# Patient Record
Sex: Female | Born: 1980 | Race: Black or African American | Hispanic: No | Marital: Married | Smoking: Never smoker
Health system: Southern US, Community
[De-identification: ages and names within clinical notes are randomized; demographics above are authoritative.]

## PROBLEM LIST (undated history)

## (undated) DIAGNOSIS — B019 Varicella without complication: Secondary | ICD-10-CM

## (undated) DIAGNOSIS — R55 Syncope and collapse: Secondary | ICD-10-CM

## (undated) DIAGNOSIS — F32A Depression, unspecified: Secondary | ICD-10-CM

## (undated) DIAGNOSIS — F329 Major depressive disorder, single episode, unspecified: Secondary | ICD-10-CM

## (undated) HISTORY — DX: Depression, unspecified: F32.A

## (undated) HISTORY — DX: Major depressive disorder, single episode, unspecified: F32.9

## (undated) HISTORY — DX: Syncope and collapse: R55

## (undated) HISTORY — DX: Varicella without complication: B01.9

---

## 2003-11-21 ENCOUNTER — Other Ambulatory Visit: Admission: RE | Admit: 2003-11-21 | Discharge: 2003-11-21 | Payer: Self-pay | Admitting: Family Medicine

## 2004-07-28 ENCOUNTER — Other Ambulatory Visit: Admission: RE | Admit: 2004-07-28 | Discharge: 2004-07-28 | Payer: Self-pay | Admitting: Family Medicine

## 2005-05-24 ENCOUNTER — Other Ambulatory Visit: Admission: RE | Admit: 2005-05-24 | Discharge: 2005-05-24 | Payer: Self-pay | Admitting: *Deleted

## 2005-09-16 ENCOUNTER — Other Ambulatory Visit: Admission: RE | Admit: 2005-09-16 | Discharge: 2005-09-16 | Payer: Self-pay | Admitting: Obstetrics and Gynecology

## 2006-06-01 ENCOUNTER — Other Ambulatory Visit: Admission: RE | Admit: 2006-06-01 | Discharge: 2006-06-01 | Payer: Self-pay | Admitting: Obstetrics and Gynecology

## 2006-10-13 ENCOUNTER — Other Ambulatory Visit: Admission: RE | Admit: 2006-10-13 | Discharge: 2006-10-13 | Payer: Self-pay | Admitting: Obstetrics & Gynecology

## 2007-10-24 ENCOUNTER — Other Ambulatory Visit: Admission: RE | Admit: 2007-10-24 | Discharge: 2007-10-24 | Payer: Self-pay | Admitting: Obstetrics and Gynecology

## 2008-10-27 ENCOUNTER — Other Ambulatory Visit: Admission: RE | Admit: 2008-10-27 | Discharge: 2008-10-27 | Payer: Self-pay | Admitting: Obstetrics and Gynecology

## 2013-06-06 ENCOUNTER — Ambulatory Visit (INDEPENDENT_AMBULATORY_CARE_PROVIDER_SITE_OTHER): Payer: BC Managed Care – PPO | Admitting: Family Medicine

## 2013-06-06 ENCOUNTER — Encounter: Payer: Self-pay | Admitting: Family Medicine

## 2013-06-06 VITALS — BP 110/70 | HR 77 | Temp 98.4°F | Ht 68.0 in | Wt 161.8 lb

## 2013-06-06 DIAGNOSIS — Z0000 Encounter for general adult medical examination without abnormal findings: Secondary | ICD-10-CM | POA: Insufficient documentation

## 2013-06-06 DIAGNOSIS — Z1331 Encounter for screening for depression: Secondary | ICD-10-CM

## 2013-06-06 LAB — HEPATIC FUNCTION PANEL
AST: 14 U/L (ref 0–37)
Alkaline Phosphatase: 65 U/L (ref 39–117)
Bilirubin, Direct: 0.1 mg/dL (ref 0.0–0.3)
Total Protein: 7.7 g/dL (ref 6.0–8.3)

## 2013-06-06 LAB — LIPID PANEL
HDL: 51.8 mg/dL (ref >39.00)
LDL Cholesterol: 49 mg/dL (ref 0–99)
Total CHOL/HDL Ratio: 2
Triglycerides: 25 mg/dL (ref 0.0–149.0)

## 2013-06-06 LAB — BASIC METABOLIC PANEL
CO2: 27 mEq/L (ref 19–32)
Calcium: 9.2 mg/dL (ref 8.4–10.5)
GFR: 103.3 mL/min (ref >60.00)
Potassium: 3.9 mEq/L (ref 3.5–5.1)
Sodium: 139 mEq/L (ref 135–145)

## 2013-06-06 LAB — CBC WITH DIFFERENTIAL/PLATELET
Basophils Absolute: 0 10*3/uL (ref 0.0–0.1)
HCT: 38.7 % (ref 36.0–46.0)
Lymphocytes Relative: 34.2 % (ref 12.0–46.0)
Lymphs Abs: 1.2 10*3/uL (ref 0.7–4.0)
Monocytes Relative: 11 % (ref 3.0–12.0)
Neutrophils Relative %: 54 % (ref 43.0–77.0)
Platelets: 254 10*3/uL (ref 150.0–400.0)
RDW: 11.7 % (ref 11.5–14.6)
WBC: 3.5 10*3/uL — ABNORMAL LOW (ref 4.5–10.5)

## 2013-06-06 NOTE — Progress Notes (Signed)
  Subjective:    Patient ID: Charlotte Garza, female    DOB: 26-Sep-1981, 32 y.o.   MRN: [id number]  HPI New to establish.  Previous MD- Deboraha Sprang 'years and years ago'.  GYN- Dr Christella Hartigan, [facility]   Review of Systems Patient reports no vision/ hearing changes, adenopathy,fever, weight change,  persistant/recurrent hoarseness , swallowing issues, chest pain, palpitations, edema, persistant/recurrent cough, hemoptysis, dyspnea (rest/exertional/paroxysmal nocturnal), gastrointestinal bleeding (melena, rectal bleeding), abdominal pain, significant heartburn, bowel changes, GU symptoms (dysuria, hematuria, incontinence), Gyn symptoms (abnormal  bleeding, pain),  syncope, focal weakness, memory loss, numbness & tingling, skin/hair/nail changes, abnormal bruising or bleeding, anxiety, or depression.     Objective:   Physical Exam General Appearance:    Alert, cooperative, no distress, appears stated age  Head:    Normocephalic, without obvious abnormality, atraumatic  Eyes:    PERRL, conjunctiva/corneas clear, EOM's intact, fundi    benign, both eyes  Ears:    Normal TM's and external ear canals, both ears  Nose:   Nares normal, septum midline, mucosa normal, no drainage    or sinus tenderness  Throat:   Lips, mucosa, and tongue normal; teeth and gums normal  Neck:   Supple, symmetrical, trachea midline, no adenopathy;    Thyroid: no enlargement/tenderness/nodules  Back:     Symmetric, no curvature, ROM normal, no CVA tenderness  Lungs:     Clear to auscultation bilaterally, respirations unlabored  Chest Wall:    No tenderness or deformity   Heart:    Regular rate and rhythm, S1 and S2 normal, no murmur, rub   or gallop  Breast Exam:    Deferred to GYN  Abdomen:     Soft, non-tender, bowel sounds active all four quadrants,    no masses, no organomegaly  Genitalia:    Deferred to GYN  Rectal:    Extremities:   Extremities normal, atraumatic, no cyanosis or edema  Pulses:   2+ and symmetric all  extremities  Skin:   Skin color, texture, turgor normal, no rashes or lesions  Lymph nodes:   Cervical, supraclavicular, and axillary nodes normal  Neurologic:   CNII-XII intact, normal strength, sensation and reflexes    throughout          Assessment & Plan:

## 2013-06-06 NOTE — Patient Instructions (Addendum)
Follow up in 1 year or as needed Keep up the good work!  You look great!! We'll notify you of your lab results and make any changes if needed Welcome!  We're glad to have you!!!

## 2013-06-10 NOTE — Assessment & Plan Note (Signed)
Pt's PE WNL.  UTD on GYN.  Check labs.  Anticipatory guidance provided.  

## 2014-06-05 ENCOUNTER — Encounter: Payer: Self-pay | Admitting: Medical

## 2014-06-05 ENCOUNTER — Ambulatory Visit (INDEPENDENT_AMBULATORY_CARE_PROVIDER_SITE_OTHER): Payer: BC Managed Care – PPO | Admitting: Medical

## 2014-06-05 VITALS — BP 105/70 | HR 79 | Temp 98.2°F | Wt 168.0 lb

## 2014-06-05 DIAGNOSIS — M791 Myalgia, unspecified site: Secondary | ICD-10-CM

## 2014-06-05 DIAGNOSIS — IMO0001 Reserved for inherently not codable concepts without codable children: Secondary | ICD-10-CM

## 2014-06-05 DIAGNOSIS — S43499A Other sprain of unspecified shoulder joint, initial encounter: Secondary | ICD-10-CM

## 2014-06-05 DIAGNOSIS — S46819A Strain of other muscles, fascia and tendons at shoulder and upper arm level, unspecified arm, initial encounter: Secondary | ICD-10-CM | POA: Insufficient documentation

## 2014-06-05 DIAGNOSIS — S46811A Strain of other muscles, fascia and tendons at shoulder and upper arm level, right arm, initial encounter: Secondary | ICD-10-CM

## 2014-06-05 MED ORDER — DICLOFENAC SODIUM 75 MG PO TBEC: 75.0000 mg | DELAYED_RELEASE_TABLET | Freq: Two times a day (BID) | ORAL | Status: DC

## 2014-06-05 MED ORDER — CYCLOBENZAPRINE HCL 10 MG PO TABS: 10.0000 mg | ORAL_TABLET | Freq: Every day | ORAL | Status: DC

## 2014-06-05 MED ORDER — KETOROLAC TROMETHAMINE 60 MG/2ML IM SOLN
60.0000 mg | Freq: Once | INTRAMUSCULAR | Status: AC
  Administered 2014-06-05: 60 mg via INTRAMUSCULAR

## 2014-06-05 MED ORDER — TRAMADOL HCL 50 MG PO TABS: 50.0000 mg | ORAL_TABLET | Freq: Four times a day (QID) | ORAL | Status: DC | PRN

## 2014-06-05 NOTE — Patient Instructions (Signed)
Your appear to have a trapezius muscle strain. We gave you toradol im in the office. You can start diclofenac tomorrow. Tonight you can take muscle relaxant. In event of breakthrough pain can take tramadol. Massage right trapezius and try to relax. If pain persist consider cspine xray. If pain toward bicep persists this also would be reason to consider xray. Follow up in 7-10 days any persisting symptoms or as needed.

## 2014-06-05 NOTE — Progress Notes (Signed)
Pre visit review using our clinic review tool, if applicable. No additional management support is needed unless otherwise documented below in the visit note. 

## 2014-06-05 NOTE — Assessment & Plan Note (Signed)
Toradol im in office. Start diclofenac tomorrow. Rx cyclobenzaprene for night time use. Rx tramadol for break through  Pain. About to leave for vacation.

## 2014-06-05 NOTE — Progress Notes (Signed)
   Subjective:    Patient ID: Charlotte Garza, female    DOB: 01/24/1981, 33 y.o.   MRN: [id number]  HPI  Pt in with some neck pain. Present for 4 days. No injury or fall. Noticed when she first woke up. Pt did sleep in a different bed on saturday but no pain on Sunday. On Monday morning woke up with pain. Later in the day some pain down to rt bicep area. No numbness in rt upper extremity.  No exercise recently. Pt is on mirena. Pt is heading out of town.     Review of Systems  Constitutional: Negative for fever, chills and fatigue.  Respiratory: Negative for cough, chest tightness and wheezing.   Cardiovascular: Negative for chest pain and palpitations.  Gastrointestinal: Negative.   Musculoskeletal: Positive for myalgias and neck pain. Negative for arthralgias, back pain, gait problem, joint swelling and neck stiffness.       Rt side trapezius pain on neck movement. Rt bicep faint occasional pain that seems to extend from trapezius region. NO shoulder pain.  Skin: Negative.  Negative for color change, pallor and rash.  Neurological: Negative for dizziness, tremors, seizures, syncope, facial asymmetry, speech difficulty, weakness, light-headedness, numbness and headaches.       No sharp radiating type pain down her rt arm.  Hematological: Negative for adenopathy. Does not bruise/bleed easily.  Psychiatric/Behavioral: Negative.        Objective:   Physical Exam  Constitutional: She appears well-developed and well-nourished. No distress.  HENT:  Head: Normocephalic and atraumatic.  Neck: Normal range of motion. Neck supple. No JVD present. No tracheal deviation present. No thyromegaly present.  On rotation of neck both to left and rt side produces rt side trapezius pain. Over full body of trapezius. NO mid cspine pain.    Cardiovascular: Normal rate and regular rhythm.   Pulmonary/Chest: Effort normal and breath sounds normal. No stridor. No respiratory distress. She has no wheezes. She  has no rales. She exhibits no tenderness.  Musculoskeletal:  Rt shoulder- no pain on palpation. On shoulder rom she does have some faint trapezius pain.   Rt upper extremity- on flexion and extension of bicep and tricep no pain presntly. No pain on palpation.   Lymphadenopathy:    She has no cervical adenopathy.  Skin: She is not diaphoretic.  No rash of back or arm.  Psychiatric: She has a normal mood and affect. Her behavior is normal. Judgment and thought content normal.           Assessment & Plan:

## 2015-12-01 LAB — HM PAP SMEAR

## 2016-07-21 ENCOUNTER — Encounter: Payer: Self-pay | Admitting: Family Medicine

## 2016-07-21 ENCOUNTER — Ambulatory Visit (INDEPENDENT_AMBULATORY_CARE_PROVIDER_SITE_OTHER): Payer: BLUE CROSS/BLUE SHIELD | Admitting: Family Medicine

## 2016-07-21 VITALS — BP 120/80 | HR 76 | Temp 98.2°F | Resp 16 | Ht 68.0 in | Wt 168.1 lb

## 2016-07-21 DIAGNOSIS — M7062 Trochanteric bursitis, left hip: Secondary | ICD-10-CM | POA: Diagnosis not present

## 2016-07-21 MED ORDER — PREDNISONE 10 MG PO TABS: ORAL_TABLET | ORAL | 0 refills | Status: DC

## 2016-07-21 NOTE — Progress Notes (Signed)
Pre visit review using our clinic review tool, if applicable. No additional management support is needed unless otherwise documented below in the visit note. 

## 2016-07-21 NOTE — Patient Instructions (Signed)
Schedule your complete physical at your convenience Start the Prednisone as directed- take w/ food No additional ibuprofen or aleve while on the Prednisone but you can add Tylenol as needed Alternate ice/heat for pain relief Continue to exercise but listen to your body- if it hurts, don't do it! If no improvement, please let me know and we'll refer you to sports med Call with any questions or concerns Hang in there!!!

## 2016-07-21 NOTE — Progress Notes (Signed)
   Subjective:    Patient ID: Charlotte Garza, female    DOB: 02-Oct-1981, 35 y.o.   MRN: [id number]  HPI L low back pain- pt reports L hip pain x5 yrs, 'since I had my son'.  But as of 1 week ago her usual Aleve is not helping her pain.  Pain is currently constant.  Pain will radiate into buttock, L hip and thigh.  Difficulty standing on L leg.  Intermittent numbness/tingling.  sxs worsen w/ prolonged sitting or standing, being barefoot.  Pain improves w/ regular exercise.  No known injury.   Review of Systems For ROS see HPI     Objective:   Physical Exam  Constitutional: She is oriented to person, place, and time. She appears well-developed and well-nourished. No distress.  Cardiovascular: Intact distal pulses.   Musculoskeletal:  No TTP over lumbar spine, paraspinal muscles or sciatic notch + TTP over L greater trochanteric bursa  Neurological: She is alert and oriented to person, place, and time. She has normal reflexes.  (-) SLR bilaterally  Skin: Skin is warm and dry. No rash noted. No erythema.  Psychiatric: She has a normal mood and affect. Her behavior is normal. Thought content normal.  Vitals reviewed.         Assessment & Plan:  L greater trochanteric bursitis- new.  Pt's sxs and PE consistent w/ bursitis.  Start Prednisone taper.  Reviewed supportive care and red flags that should prompt return.  Pt expressed understanding and is in agreement w/ plan.

## 2016-11-30 ENCOUNTER — Ambulatory Visit (INDEPENDENT_AMBULATORY_CARE_PROVIDER_SITE_OTHER): Payer: BLUE CROSS/BLUE SHIELD | Admitting: Family Medicine

## 2016-11-30 ENCOUNTER — Encounter: Payer: Self-pay | Admitting: Family Medicine

## 2016-11-30 VITALS — BP 118/78 | HR 93 | Temp 98.1°F | Resp 16 | Ht 68.0 in | Wt 172.4 lb

## 2016-11-30 DIAGNOSIS — Z0000 Encounter for general adult medical examination without abnormal findings: Secondary | ICD-10-CM

## 2016-11-30 LAB — BASIC METABOLIC PANEL
BUN: 16 mg/dL (ref 7–25)
CO2: 22 mmol/L (ref 20–31)
Calcium: 9.2 mg/dL (ref 8.6–10.2)
Chloride: 106 mmol/L (ref 98–110)
Creat: 0.94 mg/dL (ref 0.50–1.10)
Glucose, Bld: 88 mg/dL (ref 65–99)
Potassium: 4.4 mmol/L (ref 3.5–5.3)
Sodium: 140 mmol/L (ref 135–146)

## 2016-11-30 LAB — HEPATIC FUNCTION PANEL
ALT: 13 U/L (ref 6–29)
AST: 15 U/L (ref 10–30)
Albumin: 4.1 g/dL (ref 3.6–5.1)
Alkaline Phosphatase: 64 U/L (ref 33–115)
Bilirubin, Direct: 0.1 mg/dL (ref <=0.2)
Indirect Bilirubin: 0.3 mg/dL (ref 0.2–1.2)
Total Bilirubin: 0.4 mg/dL (ref 0.2–1.2)
Total Protein: 7.1 g/dL (ref 6.1–8.1)

## 2016-11-30 LAB — LIPID PANEL
Cholesterol: 105 mg/dL (ref <200)
HDL: 57 mg/dL (ref >50)
LDL Cholesterol: 40 mg/dL (ref <100)
Total CHOL/HDL Ratio: 1.8 Ratio (ref <5.0)
Triglycerides: 40 mg/dL (ref <150)
VLDL: 8 mg/dL (ref <30)

## 2016-11-30 LAB — CBC WITH DIFFERENTIAL/PLATELET
BASOS PCT: 0 %
Basophils Absolute: 0 cells/uL (ref 0–200)
EOS ABS: 0 {cells}/uL — AB (ref 15–500)
Eosinophils Relative: 0 %
HEMATOCRIT: 34.9 % — AB (ref 35.0–45.0)
Hemoglobin: 11.4 g/dL — ABNORMAL LOW (ref 11.7–15.5)
LYMPHS PCT: 31 %
Lymphs Abs: 1736 cells/uL (ref 850–3900)
MCH: 29.5 pg (ref 27.0–33.0)
MCHC: 32.7 g/dL (ref 32.0–36.0)
MCV: 90.4 fL (ref 80.0–100.0)
MONO ABS: 448 {cells}/uL (ref 200–950)
MPV: 9.9 fL (ref 7.5–12.5)
Monocytes Relative: 8 %
Neutro Abs: 3416 cells/uL (ref 1500–7800)
Neutrophils Relative %: 61 %
Platelets: 339 10*3/uL (ref 140–400)
RBC: 3.86 MIL/uL (ref 3.80–5.10)
RDW: 12.3 % (ref 11.0–15.0)
WBC: 5.6 10*3/uL (ref 3.8–10.8)

## 2016-11-30 LAB — TSH: TSH: 0.75 mIU/L

## 2016-11-30 NOTE — Progress Notes (Signed)
Pre visit review using our clinic review tool, if applicable. No additional management support is needed unless otherwise documented below in the visit note. 

## 2016-11-30 NOTE — Patient Instructions (Signed)
Follow up in 1 year or as needed We'll notify you of your lab results and make any changes if needed Continue to work on healthy diet and regular exercise- you look great! Call with any questions or concerns [place]!!!

## 2016-11-30 NOTE — Progress Notes (Signed)
   Subjective:    Patient ID: Charlotte Garza, female    DOB: 11-12-1980, 36 y.o.   MRN: [id number]  HPI CPE- UTD on GYN, Tdap.  No concerns today.     Review of Systems Patient reports no vision/ hearing changes, adenopathy,fever, weight change,  persistant/recurrent hoarseness , swallowing issues, chest pain, palpitations, edema, persistant/recurrent cough, hemoptysis, dyspnea (rest/exertional/paroxysmal nocturnal), gastrointestinal bleeding (melena, rectal bleeding), abdominal pain, significant heartburn, bowel changes, GU symptoms (dysuria, hematuria, incontinence), Gyn symptoms (abnormal  bleeding, pain),  syncope, focal weakness, memory loss, numbness & tingling, skin/hair/nail changes, abnormal bruising or bleeding, anxiety, or depression.     Objective:   Physical Exam General Appearance:    Alert, cooperative, no distress, appears stated age  Head:    Normocephalic, without obvious abnormality, atraumatic  Eyes:    PERRL, conjunctiva/corneas clear, EOM's intact, fundi    benign, both eyes  Ears:    Normal TM's and external ear canals, both ears  Nose:   Nares normal, septum midline, mucosa normal, no drainage    or sinus tenderness  Throat:   Lips, mucosa, and tongue normal; teeth and gums normal  Neck:   Supple, symmetrical, trachea midline, no adenopathy;    Thyroid: no enlargement/tenderness/nodules  Back:     Symmetric, no curvature, ROM normal, no CVA tenderness  Lungs:     Clear to auscultation bilaterally, respirations unlabored  Chest Wall:    No tenderness or deformity   Heart:    Regular rate and rhythm, S1 and S2 normal, no murmur, rub   or gallop  Breast Exam:    Deferred to GYN  Abdomen:     Soft, non-tender, bowel sounds active all four quadrants,    no masses, no organomegaly  Genitalia:    Deferred to GYN  Rectal:    Extremities:   Extremities normal, atraumatic, no cyanosis or edema  Pulses:   2+ and symmetric all extremities  Skin:   Skin color, texture,  turgor normal, no rashes or lesions  Lymph nodes:   Cervical, supraclavicular, and axillary nodes normal  Neurologic:   CNII-XII intact, normal strength, sensation and reflexes    throughout          Assessment & Plan:

## 2016-11-30 NOTE — Assessment & Plan Note (Signed)
Pt's PE WNL.  UTD on GYN, Tdap, flu.  Check labs.  Anticipatory guidance provided.

## 2016-12-01 LAB — VITAMIN D 25 HYDROXY (VIT D DEFICIENCY, FRACTURES): Vit D, 25-Hydroxy: 33 ng/mL (ref 30–100)

## 2017-07-07 ENCOUNTER — Ambulatory Visit (INDEPENDENT_AMBULATORY_CARE_PROVIDER_SITE_OTHER): Payer: BLUE CROSS/BLUE SHIELD | Admitting: Family Medicine

## 2017-07-07 ENCOUNTER — Encounter: Payer: Self-pay | Admitting: Family Medicine

## 2017-07-07 VITALS — BP 121/81 | HR 82 | Temp 98.7°F | Resp 18 | Ht 68.0 in | Wt 170.2 lb

## 2017-07-07 DIAGNOSIS — R05 Cough: Secondary | ICD-10-CM | POA: Diagnosis not present

## 2017-07-07 DIAGNOSIS — R058 Other specified cough: Secondary | ICD-10-CM

## 2017-07-07 MED ORDER — ALBUTEROL SULFATE HFA 108 (90 BASE) MCG/ACT IN AERS: 2.0000 | INHALATION_SPRAY | Freq: Four times a day (QID) | RESPIRATORY_TRACT | 2 refills | Status: DC | PRN

## 2017-07-07 MED ORDER — PREDNISONE 10 MG PO TABS: ORAL_TABLET | ORAL | 0 refills | Status: DC

## 2017-07-07 MED ORDER — ALBUTEROL SULFATE (2.5 MG/3ML) 0.083% IN NEBU
2.5000 mg | INHALATION_SOLUTION | Freq: Once | RESPIRATORY_TRACT | Status: AC
  Administered 2017-07-07: 2.5 mg via RESPIRATORY_TRACT

## 2017-07-07 NOTE — Progress Notes (Signed)
Pre visit review using our clinic review tool, if applicable. No additional management support is needed unless otherwise documented below in the visit note. 

## 2017-07-07 NOTE — Progress Notes (Signed)
   Subjective:    Patient ID: Charlotte Garza, female    DOB: 07-19-1981, 36 y.o.   MRN: [id number]  HPI Cough- pt reports wheezing and coughing along w/ some chest and back discomfort.  sxs started ~3 weeks ago.  Doctor at work prescribed Zpack and cough pills- completed abx this AM.  No fevers.  'i don't feel bad'.  No sinus pain/pressure.  No ear pain.  No known sick contacts.   Review of Systems For ROS see HPI     Objective:   Physical Exam  Constitutional: She appears well-developed and well-nourished. No distress.  HENT:  Head: Normocephalic and atraumatic.  TMs normal bilaterally Mild nasal congestion Throat w/out erythema, edema, or exudate  Eyes: Pupils are equal, round, and reactive to light. Conjunctivae and EOM are normal.  Neck: Normal range of motion. Neck supple.  Cardiovascular: Normal rate, regular rhythm, normal heart sounds and intact distal pulses.   No murmur heard. Pulmonary/Chest: Effort normal and breath sounds normal. No respiratory distress. She has no wheezes.  + near continuous cough- improved s/p neb tx  Lymphadenopathy:    She has no cervical adenopathy.  Vitals reviewed.         Assessment & Plan:  Post-viral cough- new.  Pt is otherwise feeling good but cannot stop coughing and has pulled muscles doing so.  Cough improved s/p neb tx.  Start Pred taper and use albuterol prn.  Reviewed supportive care and red flags that should prompt return.  Pt expressed understanding and is in agreement w/ plan.

## 2017-07-07 NOTE — Patient Instructions (Signed)
Follow up as needed or as scheduled Start the Prednisone as directed Use the albuterol inhale as needed for cough Drink plenty of fluids Call with any questions or concerns Hang in there!!!

## 2017-07-07 NOTE — Addendum Note (Signed)
Addended by: Davis Gourd on: 07/07/2017 10:27 AM   Modules accepted: Orders

## 2017-11-22 ENCOUNTER — Ambulatory Visit: Payer: Self-pay | Admitting: *Deleted

## 2017-11-22 NOTE — Telephone Encounter (Signed)
Pt called stating that she is under a lot of stress at work and life situations; she is unable to sleep more than 4 hours of sleep and night, having stomach problems; she also is a ball of nerves and feels like she is not performing at her optimum; per nurse triage recommendations made to include seeing a physician within 24 hours; because of prior commitments, pt is not able to accept appointment prior to her previously schedduled with Dr Birdie Riddle prior to Monday 11/27/17 at 1000; she also states that she has an appointment with her marriage counselor today and she will also discuss this anxiety at that time.    Reason for Disposition . Symptoms interfere with work or school  Answer Assessment - Initial Assessment Questions 1. CONCERN: "What happened that made you call today?"     She sees it affecting her, more irritated 2. ANXIETY SYMPTOM SCREENING: "Can you describe how you have been feeling?"  (e.g., tense, restless, panicky, anxious, keyed up, trouble sleeping, trouble concentrating)     Trouble sleeping, anxiety 3. ONSET: "How long have you been feeling this way?"     Symptoms worsen as deadline approaches; taking melatonin (5 mg) but it no longer works; started months ago   4. RECURRENT: "Have you felt this way before?"  If yes: "What happened that time?" "What helped these feelings go away in the past?"      no 5. RISK OF HARM - SUICIDAL IDEATION:  "Do you ever have thoughts of hurting or killing yourself?"  (e.g., yes, no, no but preoccupation with thoughts about death)   - INTENT:  "Do you have thoughts of hurting or killing yourself right NOW?" (e.g., yes, no, N/A)   - PLAN: "Do you have a specific plan for how you would do this?" (e.g., gun, knife, overdose, no plan, N/A)     no 6. RISK OF HARM - HOMICIDAL IDEATION:  "Do you ever have thoughts of hurting or killing someone else?"  (e.g., yes, no, no but preoccupation with thoughts about death)   - INTENT:  "Do you have thoughts of  hurting or killing someone right NOW?" (e.g., yes, no, N/A)   - PLAN: "Do you have a specific plan for how you would do this?" (e.g., gun, knife, no plan, N/A)      no 7. FUNCTIONAL IMPAIRMENT: "How have things been going for you overall in your life? Have you had any more difficulties than usual doing your normal daily activities?"  (e.g., better, same, worse; self-care, school, work, interactions)     More stressful at work; about to start new program at work 68. SUPPORT: "Who is with you now?" "Who do you live with?" "Do you have family or friends nearby who you can talk to?"      Yes supportive husband and family 63. THERAPIST: "Do you have a counselor or therapist? Name?"     Marriage counselor, Dr Margaretmary Lombard 38. 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 CAFFEINE ABUSE: "Do you drink caffeinated beverages, and how much each day?" (e.g., coffee, tea, colas)       1 cup coffee in the morning 12. SUBSTANCE ABUSE: "Do you use any illegal drugs or alcohol?"       occassional glass of red wine in the evening 13. OTHER SYMPTOMS: "Do you  have any other physical symptoms right now?" (e.g., chest pain, palpitations, difficulty breathing, fever)       Problems with stomach (loose stools) 14. PREGNANCY: "Is there any chance you are pregnant?" "When was your last menstrual period?"       No LMP 11/17/17  Protocols used: ANXIETY AND PANIC ATTACK-A-AH

## 2017-11-24 ENCOUNTER — Ambulatory Visit: Payer: BLUE CROSS/BLUE SHIELD | Admitting: Family Medicine

## 2017-11-27 ENCOUNTER — Encounter: Payer: Self-pay | Admitting: General Practice

## 2017-11-27 ENCOUNTER — Ambulatory Visit: Payer: BLUE CROSS/BLUE SHIELD | Admitting: Family Medicine

## 2018-06-11 LAB — HM PAP SMEAR

## 2019-06-12 ENCOUNTER — Encounter: Payer: Self-pay | Admitting: Family Medicine

## 2019-06-12 ENCOUNTER — Other Ambulatory Visit: Payer: Self-pay

## 2019-06-12 ENCOUNTER — Ambulatory Visit (INDEPENDENT_AMBULATORY_CARE_PROVIDER_SITE_OTHER): Payer: BC Managed Care – PPO | Admitting: Family Medicine

## 2019-06-12 VITALS — Ht 68.0 in | Wt 178.0 lb

## 2019-06-12 DIAGNOSIS — U071 COVID-19: Secondary | ICD-10-CM

## 2019-06-12 DIAGNOSIS — R059 Cough, unspecified: Secondary | ICD-10-CM

## 2019-06-12 DIAGNOSIS — R05 Cough: Secondary | ICD-10-CM

## 2019-06-12 DIAGNOSIS — J988 Other specified respiratory disorders: Secondary | ICD-10-CM | POA: Diagnosis not present

## 2019-06-12 MED ORDER — FLUTICASONE PROPIONATE 50 MCG/ACT NA SUSP: 2.0000 | Freq: Every day | NASAL | 6 refills | Status: DC

## 2019-06-12 MED ORDER — GUAIFENESIN-CODEINE 100-10 MG/5ML PO SYRP: 10.0000 mL | ORAL_SOLUTION | Freq: Three times a day (TID) | ORAL | 0 refills | Status: DC | PRN

## 2019-06-12 MED ORDER — PREDNISONE 10 MG PO TABS: ORAL_TABLET | ORAL | 0 refills | Status: DC

## 2019-06-12 NOTE — Progress Notes (Signed)
I have discussed the procedure for the virtual visit with the patient who has given consent to proceed with assessment and treatment.   Jessica L Brodmerkel, CMA     

## 2019-06-12 NOTE — Progress Notes (Signed)
   Virtual Visit via Video   I connected with patient on 06/12/19 at  3:15 PM EDT by a video enabled telemedicine application and verified that I am speaking with the correct person using two identifiers.  Location patient: Home Location provider: Acupuncturist, Office Persons participating in the virtual visit: Patient, Provider, [place] (Jess B)  I discussed the limitations of evaluation and management by telemedicine and the availability of in person appointments. The patient expressed understanding and agreed to proceed.  Subjective:   HPI:   Cough- 'I had a cold about 2 weeks ago'.  Pt reports for 1.5 weeks she has had a 'cough w/ mucous'.  Using inhaler, taking allergy medication (Allegra), and OTC sinus medication (phenylephrine).  Tm 99.  Mild sinus headache.  No body aches.  No chills, no known sick contacts.  No known COVID exposures- wearing mask while in stores.  Cough is described 'as an annoyance' more than feeling sick.  Pt feels inhaler is temporarily helpful.  Never formally dx'd w/ asthma but has tendency to wheeze when sick.  ROS:   See pertinent positives and negatives per HPI.  Patient Active Problem List   Diagnosis Date Noted  . Trapezius strain 06/05/2014  . Routine general medical examination at a health care facility 06/06/2013    Social History   Tobacco Use  . Smoking status: Never Smoker  . Smokeless tobacco: Never Used  Substance Use Topics  . Alcohol use: Yes    Current Outpatient Medications:  .  albuterol (PROVENTIL HFA;VENTOLIN HFA) 108 (90 Base) MCG/ACT inhaler, Inhale 2 puffs into the lungs every 6 (six) hours as needed for wheezing or shortness of breath., Disp: 1 Inhaler, Rfl: 2 .  fexofenadine (ALLEGRA) 60 MG tablet, Take 60 mg by mouth 2 (two) times daily., Disp: , Rfl:   No Known Allergies  Objective:   Ht 5\' 8"  (1.727 m)   Wt 178 lb (80.7 kg)   BMI 27.06 kg/m   AAOx3, NAD NCAT, EOMI No obvious CN deficits Coloring WNL Pt  is able to speak clearly, coherently without shortness of breath or increased work of breathing.  Thought process is linear.  Mood is appropriate.   Assessment and Plan:   Cough- pt reports cough x1.5 weeks.  Increased wheezing.  Using inhaler w/ only temporary relief.  Will add Flonase to her daily allegra.  Arrange COVID testing- encouraged her to self isolate.  Start Prednisone taper and cough syrup to improve wheeze, cough, and SOB.  Reviewed supportive care and red flags that should prompt return.  Pt expressed understanding and is in agreement w/ plan.    Annye Asa, MD 06/12/2019

## 2019-06-13 ENCOUNTER — Other Ambulatory Visit: Payer: Self-pay

## 2019-06-13 ENCOUNTER — Encounter (INDEPENDENT_AMBULATORY_CARE_PROVIDER_SITE_OTHER): Payer: Self-pay

## 2019-06-13 DIAGNOSIS — Z20822 Contact with and (suspected) exposure to covid-19: Secondary | ICD-10-CM

## 2019-06-14 ENCOUNTER — Encounter (INDEPENDENT_AMBULATORY_CARE_PROVIDER_SITE_OTHER): Payer: Self-pay

## 2019-06-14 LAB — NOVEL CORONAVIRUS, NAA: SARS-CoV-2, NAA: NOT DETECTED

## 2019-12-16 ENCOUNTER — Ambulatory Visit (INDEPENDENT_AMBULATORY_CARE_PROVIDER_SITE_OTHER): Payer: 59 | Admitting: Family Medicine

## 2019-12-16 ENCOUNTER — Encounter: Payer: Self-pay | Admitting: Family Medicine

## 2019-12-16 ENCOUNTER — Other Ambulatory Visit: Payer: Self-pay

## 2019-12-16 VITALS — Temp 97.9°F | Ht 68.0 in | Wt 179.0 lb

## 2019-12-16 DIAGNOSIS — F419 Anxiety disorder, unspecified: Secondary | ICD-10-CM | POA: Diagnosis not present

## 2019-12-16 DIAGNOSIS — G4700 Insomnia, unspecified: Secondary | ICD-10-CM | POA: Diagnosis not present

## 2019-12-16 MED ORDER — ESCITALOPRAM OXALATE 5 MG PO TABS: 5.0000 mg | ORAL_TABLET | Freq: Every day | ORAL | 3 refills | Status: DC

## 2019-12-16 MED ORDER — TRAZODONE HCL 50 MG PO TABS: 25.0000 mg | ORAL_TABLET | Freq: Every evening | ORAL | 3 refills | Status: DC | PRN

## 2019-12-16 MED ORDER — TRAZODONE HCL 100 MG PO TABS: ORAL_TABLET | ORAL | 3 refills | Status: DC

## 2019-12-16 NOTE — Progress Notes (Signed)
I have discussed the procedure for the virtual visit with the patient who has given consent to proceed with assessment and treatment.   Charlotte Garza, CMA     

## 2019-12-16 NOTE — Progress Notes (Signed)
   Virtual Visit via Video   I connected with patient on 12/16/19 at  8:00 AM EST by a video enabled telemedicine application and verified that I am speaking with the correct person using two identifiers.  Location patient: Home Location provider: Acupuncturist, Office Persons participating in the virtual visit: Patient, Provider, [place] (Jess B)  I discussed the limitations of evaluation and management by telemedicine and the availability of in person appointments. The patient expressed understanding and agreed to proceed.  Subjective:   HPI:   Insomnia- started new job and has a lot of new stress and anxiety.  Sleeping habits 'have completely changed'.  Now having trouble falling asleep and staying asleep.  'I can't stop all of the thoughts'.  Pt reports she is 'able to focus during the day' but anxiety will 'all come up' at night when she is no longer busy.  ROS:   See pertinent positives and negatives per HPI.  Patient Active Problem List   Diagnosis Date Noted  . Trapezius strain 06/05/2014  . Routine general medical examination at a health care facility 06/06/2013    Social History   Tobacco Use  . Smoking status: Never Smoker  . Smokeless tobacco: Never Used  Substance Use Topics  . Alcohol use: Yes    Current Outpatient Medications:  .  fexofenadine (ALLEGRA) 60 MG tablet, Take 60 mg by mouth 2 (two) times daily., Disp: , Rfl:  .  albuterol (PROVENTIL HFA;VENTOLIN HFA) 108 (90 Base) MCG/ACT inhaler, Inhale 2 puffs into the lungs every 6 (six) hours as needed for wheezing or shortness of breath. (Patient not taking: Reported on 12/16/2019), Disp: 1 Inhaler, Rfl: 2 .  fluticasone (FLONASE) 50 MCG/ACT nasal spray, Place 2 sprays into both nostrils daily. (Patient not taking: Reported on 12/16/2019), Disp: 16 g, Rfl: 6  No Known Allergies  Objective:   Temp 97.9 F (36.6 C) (Oral)   Ht 5\' 8"  (1.727 m)   Wt 179 lb (81.2 kg)   BMI 27.22 kg/m   AAOx3, NAD NCAT,  EOMI No obvious CN deficits Coloring WNL Pt is able to speak clearly, coherently without shortness of breath or increased work of breathing.  Thought process is linear.  Mood is appropriate.   Assessment and Plan:   Insomnia- new.  Likely anxiety related.  Will start Trazodone for immediate relief of sxs while addressing the underlying anxiety w/ SSRI.  Pt expressed understanding and is in agreement w/ plan.   Anxiety- new.  Pt reports she has a lot of stress and anxiety w/ her new job.  Will send prescription for Lexapro 5mg  daily and pt will decide if she needs to start this in addition to the Trazodone.  Will follow closely   Annye Asa, MD 12/16/2019

## 2019-12-17 ENCOUNTER — Telehealth: Payer: Self-pay | Admitting: Family Medicine

## 2019-12-17 NOTE — Telephone Encounter (Signed)
LM for pt to call back to schedule a VV in about 3-4wks for a insomnia f/up

## 2020-01-07 ENCOUNTER — Other Ambulatory Visit: Payer: Self-pay | Admitting: Family Medicine

## 2020-01-10 ENCOUNTER — Ambulatory Visit: Payer: 59

## 2020-01-13 ENCOUNTER — Encounter: Payer: Self-pay | Admitting: Family Medicine

## 2020-01-13 ENCOUNTER — Other Ambulatory Visit: Payer: Self-pay

## 2020-01-13 ENCOUNTER — Telehealth (INDEPENDENT_AMBULATORY_CARE_PROVIDER_SITE_OTHER): Payer: 59 | Admitting: Family Medicine

## 2020-01-13 DIAGNOSIS — F419 Anxiety disorder, unspecified: Secondary | ICD-10-CM

## 2020-01-13 DIAGNOSIS — G4700 Insomnia, unspecified: Secondary | ICD-10-CM

## 2020-01-13 NOTE — Progress Notes (Signed)
I have discussed the procedure for the virtual visit with the patient who has given consent to proceed with assessment and treatment.   Pt unable to obtain vitals.   Charlotte Garza, CMA     

## 2020-01-13 NOTE — Progress Notes (Signed)
   Virtual Visit via Video   I connected with patient on 01/13/20 at  1:00 PM EDT by a video enabled telemedicine application and verified that I am speaking with the correct person using two identifiers.  Location patient: Home Location provider: Acupuncturist, Office Persons participating in the virtual visit: Patient, Provider, [place] (Jess B)  I discussed the limitations of evaluation and management by telemedicine and the availability of in person appointments. The patient expressed understanding and agreed to proceed.  Subjective:   HPI:   Anxiety/Insomnia- at last visit she was started on Lexapro 5mg  daily and Trazodone for sleep.  She reports that she is feeling 'much better' since starting medication.  She reports that if she doesn't take the trazodone at night she will still wake at 2-3am but when she takes the medication she is able to sleep well, still wake up early, and have no groggy feelings or medication hangover.  ROS:   See pertinent positives and negatives per HPI.  Patient Active Problem List   Diagnosis Date Noted  . Trapezius strain 06/05/2014  . Routine general medical examination at a health care facility 06/06/2013    Social History   Tobacco Use  . Smoking status: Never Smoker  . Smokeless tobacco: Never Used  Substance Use Topics  . Alcohol use: Yes    Current Outpatient Medications:  .  albuterol (PROVENTIL HFA;VENTOLIN HFA) 108 (90 Base) MCG/ACT inhaler, Inhale 2 puffs into the lungs every 6 (six) hours as needed for wheezing or shortness of breath., Disp: 1 Inhaler, Rfl: 2 .  escitalopram (LEXAPRO) 5 MG tablet, TAKE 1 TABLET BY MOUTH EVERY DAY, Disp: 90 tablet, Rfl: 1 .  fexofenadine (ALLEGRA) 60 MG tablet, Take 60 mg by mouth 2 (two) times daily., Disp: , Rfl:  .  fluticasone (FLONASE) 50 MCG/ACT nasal spray, Place 2 sprays into both nostrils daily., Disp: 16 g, Rfl: 6 .  traZODone (DESYREL) 50 MG tablet, TAKE 0.5-1 TABLETS (25-50 MG TOTAL)  BY MOUTH AT BEDTIME AS NEEDED FOR SLEEP., Disp: 90 tablet, Rfl: 1  No Known Allergies  Objective:   There were no vitals taken for this visit.  AAOx3, NAD NCAT, EOMI No obvious CN deficits Coloring WNL Pt is able to speak clearly, coherently without shortness of breath or increased work of breathing.  Thought process is linear.  Mood is appropriate.   Assessment and Plan:   Anxiety/Insomnia- both have improved since last visit w/ the addition of Lexapro and Trazodone.  Pt is very pleased with the results and has no desire to change meds at this time.  Will follow.   Annye Asa, MD 01/13/2020

## 2020-04-22 ENCOUNTER — Telehealth: Payer: Self-pay | Admitting: Family Medicine

## 2020-04-22 NOTE — Telephone Encounter (Signed)
Unfortunately, pt would need an appointment for evaluation. We cannot just send in a medication per PCP.

## 2020-04-22 NOTE — Telephone Encounter (Signed)
Patient is experiencing effects from sun poisoning on her arms.  She is asking about a cream that could be called in - she is not sure what it is.

## 2020-04-22 NOTE — Telephone Encounter (Signed)
Called and left voicemail telling patient that she would need to be seen.

## 2020-04-30 ENCOUNTER — Encounter: Payer: Self-pay | Admitting: Family Medicine

## 2020-04-30 ENCOUNTER — Other Ambulatory Visit: Payer: Self-pay

## 2020-04-30 ENCOUNTER — Telehealth (INDEPENDENT_AMBULATORY_CARE_PROVIDER_SITE_OTHER): Payer: No Typology Code available for payment source | Admitting: Family Medicine

## 2020-04-30 DIAGNOSIS — L564 Polymorphous light eruption: Secondary | ICD-10-CM

## 2020-04-30 MED ORDER — TRIAMCINOLONE ACETONIDE 0.1 % EX OINT: 1.0000 "application " | TOPICAL_OINTMENT | Freq: Two times a day (BID) | CUTANEOUS | 1 refills | Status: AC

## 2020-04-30 NOTE — Progress Notes (Signed)
   Virtual Visit via Video   I connected with patient on 04/30/20 at  9:30 AM EDT by a video enabled telemedicine application and verified that I am speaking with the correct person using two identifiers.  Location patient: Home Location provider: Acupuncturist, Office Persons participating in the virtual visit: Patient, Provider, [place] (Jess B)  I discussed the limitations of evaluation and management by telemedicine and the availability of in person appointments. The patient expressed understanding and agreed to proceed.  Subjective:   HPI:   Sun poisoning- pt reports she will get 'sun poisoning on my arms' over the last few years.  Pt reports she will break out in 'bumps which can turn into blisters'.  Pt reports she will have a 'burning itch'.  Pt has breakouts if UV rays are high.  Pt reports sunscreen SPF 50-70 is helpful, will wear a long sleeve swim top at times.  Mom has similar sun sensitivity and uses Triamcinolone 0.1% to help.  Rash appears after sun exposure.  ROS:   See pertinent positives and negatives per HPI.  Patient Active Problem List   Diagnosis Date Noted  . Trapezius strain 06/05/2014  . Routine general medical examination at a health care facility 06/06/2013    Social History   Tobacco Use  . Smoking status: Never Smoker  . Smokeless tobacco: Never Used  Substance Use Topics  . Alcohol use: Yes    Current Outpatient Medications:  .  albuterol (PROVENTIL HFA;VENTOLIN HFA) 108 (90 Base) MCG/ACT inhaler, Inhale 2 puffs into the lungs every 6 (six) hours as needed for wheezing or shortness of breath., Disp: 1 Inhaler, Rfl: 2 .  escitalopram (LEXAPRO) 5 MG tablet, TAKE 1 TABLET BY MOUTH EVERY DAY, Disp: 90 tablet, Rfl: 1 .  fexofenadine (ALLEGRA) 60 MG tablet, Take 60 mg by mouth 2 (two) times daily., Disp: , Rfl:  .  fluticasone (FLONASE) 50 MCG/ACT nasal spray, Place 2 sprays into both nostrils daily., Disp: 16 g, Rfl: 6 .  traZODone (DESYREL) 50 MG  tablet, TAKE 0.5-1 TABLETS (25-50 MG TOTAL) BY MOUTH AT BEDTIME AS NEEDED FOR SLEEP., Disp: 90 tablet, Rfl: 1  No Known Allergies  Objective:   There were no vitals taken for this visit. AAOx3, NAD NCAT, EOMI No obvious CN deficits Coloring WNL Pt is able to speak clearly, coherently without shortness of breath or increased work of breathing.  Thought process is linear.  Mood is appropriate.   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  Occurs with extensive or prolonged sun exposure.  Takes Allegra daily.  Discussed sun protection/prevention.  Will send prescription for Triamcinolone ointment for pt to use if eruption occurs.  Pt expressed understanding and is in agreement w/ plan.    Annye Asa, MD 04/30/2020

## 2020-04-30 NOTE — Progress Notes (Signed)
I have discussed the procedure for the virtual visit with the patient who has given consent to proceed with assessment and treatment.   Pt unable to obtain vitals.   Pt is concerned about the sun poisoning she gets. Is there something she could have on hand in case it happens again.   Davis Gourd, CMA

## 2020-07-23 ENCOUNTER — Other Ambulatory Visit: Payer: Self-pay | Admitting: Family Medicine

## 2020-08-05 ENCOUNTER — Encounter: Payer: Self-pay | Admitting: Family Medicine

## 2020-08-05 ENCOUNTER — Ambulatory Visit (INDEPENDENT_AMBULATORY_CARE_PROVIDER_SITE_OTHER)
Admission: RE | Admit: 2020-08-05 | Discharge: 2020-08-05 | Disposition: A | Discharge status: Home / Self Care | Payer: No Typology Code available for payment source | Source: Ambulatory Visit | Attending: Family Medicine | Admitting: Family Medicine

## 2020-08-05 ENCOUNTER — Ambulatory Visit: Payer: No Typology Code available for payment source | Admitting: Family Medicine

## 2020-08-05 ENCOUNTER — Other Ambulatory Visit: Payer: Self-pay

## 2020-08-05 VITALS — BP 122/78 | HR 94 | Temp 98.2°F | Resp 15 | Ht 68.0 in | Wt 185.0 lb

## 2020-08-05 DIAGNOSIS — M545 Low back pain, unspecified: Secondary | ICD-10-CM

## 2020-08-05 DIAGNOSIS — Z23 Encounter for immunization: Secondary | ICD-10-CM | POA: Diagnosis not present

## 2020-08-05 MED ORDER — MELOXICAM 15 MG PO TABS: 15.0000 mg | ORAL_TABLET | Freq: Every day | ORAL | 0 refills | Status: DC

## 2020-08-05 NOTE — Progress Notes (Signed)
   Subjective:    Patient ID: Charlotte Garza, female    DOB: 08/15/1981, 39 y.o.   MRN: [id number]  HPI Sacral pain- sxs started since birth of her son 8 yrs ago.  Within the last 3 months 'pain has changed'.  Pain is now 'dull'.  No pain w/ working out.  Pain is notable when sitting or lying down.  Pt reports pain is in low back and down into tailbone.  Pain starts at waistband and extends down.  No TTP.  Has been using 'back patches' frequently.  Pt reports rare radiation of pain into thigh.  Pain is primarily R sided.  No known injury.  Pt reports she does have mild scoliosis.  Has not had any imaging of low back.  Pt reports less pain when leaning forward.   Review of Systems For ROS see HPI   This visit occurred during the SARS-CoV-2 public health emergency.  Safety protocols were in place, including screening questions prior to the visit, additional usage of staff PPE, and extensive cleaning of exam room while observing appropriate contact time as indicated for disinfecting solutions.       Objective:   Physical Exam Vitals reviewed.  Constitutional:      General: She is not in acute distress.    Appearance: Normal appearance. She is not ill-appearing.  HENT:     Head: Normocephalic and atraumatic.  Eyes:     Extraocular Movements: Extraocular movements intact.     Conjunctiva/sclera: Conjunctivae normal.     Pupils: Pupils are equal, round, and reactive to light.  Cardiovascular:     Pulses: Normal pulses.  Musculoskeletal:        General: No tenderness or deformity.     Comments: No deformity or TTP over spine Good forward flexion of spine, pain w/ extension  Skin:    General: Skin is warm and dry.     Findings: No rash.  Neurological:     General: No focal deficit present.     Mental Status: She is alert and oriented to person, place, and time.     Cranial Nerves: No cranial nerve deficit.     Motor: No weakness.     Coordination: Coordination normal.     Gait: Gait  normal.     Deep Tendon Reflexes: Reflexes normal.     Comments: Mildly + SLR bilaterally  Psychiatric:        Mood and Affect: Mood normal.        Behavior: Behavior normal.        Thought Content: Thought content normal.           Assessment & Plan:  Lumbosacral pain- new to provider, ongoing for pt.  Sxs started yrs ago but changed in the last 3 months.  Pain worse w/ sitting or lying.  Pain w/ back extension.  No TTP over spine.  Will get lumbosacral xrays to assess.  Start daily NSAID for inflammation.  If no major abnormalities on xrays will refer to PT.  Pt expressed understanding and is in agreement w/ plan.

## 2020-08-05 NOTE — Patient Instructions (Signed)
Follow up as needed or as scheduled Go to [place] [facility] and get your xray done.  No appt needed.  Radiology is in the basement. START the Meloxicam once daily- take w/ food- to help w/ inflammation Once I can review the xrays, we'll refer to PT if still appropriate Call with any questions or concerns Hang in there!

## 2020-08-07 ENCOUNTER — Other Ambulatory Visit: Payer: Self-pay | Admitting: General Practice

## 2020-08-07 DIAGNOSIS — M545 Low back pain, unspecified: Secondary | ICD-10-CM

## 2021-03-04 ENCOUNTER — Other Ambulatory Visit: Payer: Self-pay | Admitting: Family Medicine

## 2021-03-04 NOTE — Telephone Encounter (Signed)
LFD 07/23/20 #90 with 1 refill LOV  08/05/20 NOV none

## 2021-04-07 ENCOUNTER — Encounter: Payer: Self-pay | Admitting: *Deleted

## 2021-09-06 ENCOUNTER — Encounter: Payer: Self-pay | Admitting: Family Medicine

## 2021-09-06 ENCOUNTER — Ambulatory Visit: Payer: No Typology Code available for payment source | Admitting: Family Medicine

## 2021-09-06 VITALS — BP 112/72 | HR 88 | Temp 98.4°F | Resp 16 | Ht 68.0 in | Wt 189.4 lb

## 2021-09-06 DIAGNOSIS — Z0000 Encounter for general adult medical examination without abnormal findings: Secondary | ICD-10-CM | POA: Diagnosis not present

## 2021-09-06 DIAGNOSIS — Z1159 Encounter for screening for other viral diseases: Secondary | ICD-10-CM

## 2021-09-06 DIAGNOSIS — E663 Overweight: Secondary | ICD-10-CM | POA: Diagnosis not present

## 2021-09-06 DIAGNOSIS — Z23 Encounter for immunization: Secondary | ICD-10-CM | POA: Diagnosis not present

## 2021-09-06 DIAGNOSIS — Z114 Encounter for screening for human immunodeficiency virus [HIV]: Secondary | ICD-10-CM | POA: Diagnosis not present

## 2021-09-06 NOTE — Assessment & Plan Note (Signed)
Pt's PE WNL.  UTD on pap, Tdap.  Flu shot given today.  Check labs.  Anticipatory guidance provided.

## 2021-09-06 NOTE — Progress Notes (Signed)
   Subjective:    Patient ID: Charlotte Garza, female    DOB: April 23, 1981, 40 y.o.   MRN: [id number]  HPI CPE- UTD on Tdap.  Due for flu.  UTD on pap.  Health Maintenance  Topic Date Due   HIV Screening  Never done   Hepatitis C Screening  Never done   COVID-19 Vaccine (3 - Booster for Pfizer series) 04/07/2020   INFLUENZA VACCINE  05/10/2021   TETANUS/TDAP  12/15/2021   PAP SMEAR-Modifier  11/27/2023   Pneumococcal Vaccine 61-36 Years old  Aged Out   HPV VACCINES  Aged Out      Review of Systems Patient reports no vision/ hearing changes, adenopathy,fever, weight change,  persistant/recurrent hoarseness , swallowing issues, chest pain, palpitations, edema, persistant/recurrent cough, hemoptysis, dyspnea (rest/exertional/paroxysmal nocturnal), gastrointestinal bleeding (melena, rectal bleeding), abdominal pain, significant heartburn, bowel changes, GU symptoms (dysuria, hematuria, incontinence), Gyn symptoms (abnormal  bleeding, pain),  syncope, focal weakness, memory loss, numbness & tingling, hair/nail changes, abnormal bruising or bleeding, anxiety, or depression.   'red mole' on L breast  This visit occurred during the SARS-CoV-2 public health emergency.  Safety protocols were in place, including screening questions prior to the visit, additional usage of staff PPE, and extensive cleaning of exam room while observing appropriate contact time as indicated for disinfecting solutions.      Objective:   Physical Exam General Appearance:    Alert, cooperative, no distress, appears stated age  Head:    Normocephalic, without obvious abnormality, atraumatic  Eyes:    PERRL, conjunctiva/corneas clear, EOM's intact, fundi    benign, both eyes  Ears:    Normal TM's and external ear canals, both ears  Nose:   Deferred due to COVID  Throat:   Neck:   Supple, symmetrical, trachea midline, no adenopathy;    Thyroid: no enlargement/tenderness/nodules  Back:     Symmetric, no curvature, ROM  normal, no CVA tenderness  Lungs:     Clear to auscultation bilaterally, respirations unlabored  Chest Wall:    No tenderness or deformity   Heart:    Regular rate and rhythm, S1 and S2 normal, no murmur, rub   or gallop  Breast Exam:    Deferred to GYN  Abdomen:     Soft, non-tender, bowel sounds active all four quadrants,    no masses, no organomegaly  Genitalia:    Deferred to GYN  Rectal:    Extremities:   Extremities normal, atraumatic, no cyanosis or edema  Pulses:   2+ and symmetric all extremities  Skin:   Skin color, texture, turgor normal, no rashes or lesions.  Small cherry angioma on L breast  Lymph nodes:   Cervical, supraclavicular, and axillary nodes normal  Neurologic:   CNII-XII intact, normal strength, sensation and reflexes    throughout          Assessment & Plan:

## 2021-09-06 NOTE — Patient Instructions (Addendum)
Follow up in 1 year or as needed We'll notify you of your lab results and make any changes if needed Continue to work on healthy diet and regular exercise- you're doing great!!! Call with any questions or concerns Stay Safe!  Stay Healthy!! Happy Holidays!!! 

## 2021-09-07 LAB — BASIC METABOLIC PANEL
BUN: 15 mg/dL (ref 6–23)
CO2: 27 mEq/L (ref 19–32)
Calcium: 9.5 mg/dL (ref 8.4–10.5)
Chloride: 101 mEq/L (ref 96–112)
Creatinine, Ser: 0.9 mg/dL (ref 0.40–1.20)
GFR: 80.83 mL/min (ref >60.00)
Glucose, Bld: 82 mg/dL (ref 70–99)
Potassium: 4 mEq/L (ref 3.5–5.1)
Sodium: 137 mEq/L (ref 135–145)

## 2021-09-07 LAB — LIPID PANEL
Cholesterol: 145 mg/dL (ref 0–200)
HDL: 70 mg/dL (ref >39.00)
LDL Cholesterol: 56 mg/dL (ref 0–99)
NonHDL: 75
Total CHOL/HDL Ratio: 2
Triglycerides: 93 mg/dL (ref 0.0–149.0)
VLDL: 18.6 mg/dL (ref 0.0–40.0)

## 2021-09-07 LAB — HEPATIC FUNCTION PANEL
ALT: 21 U/L (ref 0–35)
AST: 23 U/L (ref 0–37)
Albumin: 4.4 g/dL (ref 3.5–5.2)
Alkaline Phosphatase: 68 U/L (ref 39–117)
Bilirubin, Direct: 0.1 mg/dL (ref 0.0–0.3)
Total Bilirubin: 0.6 mg/dL (ref 0.2–1.2)
Total Protein: 7.4 g/dL (ref 6.0–8.3)

## 2021-09-07 LAB — CBC WITH DIFFERENTIAL/PLATELET
Basophils Absolute: 0 10*3/uL (ref 0.0–0.1)
Basophils Relative: 0.7 % (ref 0.0–3.0)
Eosinophils Absolute: 0 10*3/uL (ref 0.0–0.7)
Eosinophils Relative: 0.6 % (ref 0.0–5.0)
HCT: 39.6 % (ref 36.0–46.0)
Hemoglobin: 13.2 g/dL (ref 12.0–15.0)
Lymphocytes Relative: 18.8 % (ref 12.0–46.0)
Lymphs Abs: 1.1 10*3/uL (ref 0.7–4.0)
MCHC: 33.3 g/dL (ref 30.0–36.0)
MCV: 92.5 fl (ref 78.0–100.0)
Monocytes Absolute: 0.5 10*3/uL (ref 0.1–1.0)
Monocytes Relative: 9.4 % (ref 3.0–12.0)
Neutro Abs: 4.1 10*3/uL (ref 1.4–7.7)
Neutrophils Relative %: 70.5 % (ref 43.0–77.0)
Platelets: 298 10*3/uL (ref 150.0–400.0)
RBC: 4.28 Mil/uL (ref 3.87–5.11)
RDW: 12.2 % (ref 11.5–15.5)
WBC: 5.8 10*3/uL (ref 4.0–10.5)

## 2021-09-07 LAB — HIV ANTIBODY (ROUTINE TESTING W REFLEX): HIV 1&2 Ab, 4th Generation: NONREACTIVE

## 2021-09-07 LAB — TSH: TSH: 1.11 u[IU]/mL (ref 0.35–5.50)

## 2021-09-07 LAB — HEPATITIS C ANTIBODY
Hepatitis C Ab: NONREACTIVE
SIGNAL TO CUT-OFF: 0.07 (ref <1.00)

## 2021-09-09 ENCOUNTER — Telehealth: Payer: Self-pay

## 2021-09-09 NOTE — Telephone Encounter (Signed)
Lvm for patient letting her know that her labs looked great.

## 2021-09-09 NOTE — Telephone Encounter (Signed)
-----   Message from Midge Minium, MD sent at 09/07/2021  2:19 PM EST ----- Labs look great!  No changes at this time

## 2021-09-11 ENCOUNTER — Other Ambulatory Visit: Payer: Self-pay | Admitting: Family Medicine

## 2021-09-13 NOTE — Telephone Encounter (Signed)
Patient is requesting a refill of the following medications: Requested Prescriptions   Pending Prescriptions Disp Refills   traZODone (DESYREL) 50 MG tablet [Pharmacy Med Name: TRAZODONE 50 MG TABLET] 90 tablet 1    Sig: TAKE 1/2 TO 1 TABLET BY MOUTH AT BEDTIME AS NEEDED FOR SLEEP    Date of patient request: 09/11/2021 Last office visit: 09/06/2021 Date of last refill: 03/04/2021 Last refill amount: 90 tablets 1 refill  Follow up time period per chart: 09/07/2022

## 2021-11-23 ENCOUNTER — Ambulatory Visit: Payer: No Typology Code available for payment source | Admitting: Family Medicine

## 2021-11-23 ENCOUNTER — Encounter: Payer: Self-pay | Admitting: Family Medicine

## 2021-11-23 VITALS — BP 126/70 | HR 74 | Temp 98.8°F | Resp 16 | Ht 68.0 in | Wt 186.4 lb

## 2021-11-23 DIAGNOSIS — R140 Abdominal distension (gaseous): Secondary | ICD-10-CM

## 2021-11-23 DIAGNOSIS — K219 Gastro-esophageal reflux disease without esophagitis: Secondary | ICD-10-CM

## 2021-11-23 DIAGNOSIS — R195 Other fecal abnormalities: Secondary | ICD-10-CM | POA: Diagnosis not present

## 2021-11-23 LAB — CBC WITH DIFFERENTIAL/PLATELET
Basophils Absolute: 0 10*3/uL (ref 0.0–0.1)
Basophils Relative: 0.4 % (ref 0.0–3.0)
Eosinophils Absolute: 0 10*3/uL (ref 0.0–0.7)
Eosinophils Relative: 1.1 % (ref 0.0–5.0)
HCT: 41.6 % (ref 36.0–46.0)
Hemoglobin: 13.5 g/dL (ref 12.0–15.0)
Lymphocytes Relative: 30.4 % (ref 12.0–46.0)
Lymphs Abs: 1.1 10*3/uL (ref 0.7–4.0)
MCHC: 32.4 g/dL (ref 30.0–36.0)
MCV: 92.9 fl (ref 78.0–100.0)
Monocytes Absolute: 0.4 10*3/uL (ref 0.1–1.0)
Monocytes Relative: 11.3 % (ref 3.0–12.0)
Neutro Abs: 2.1 10*3/uL (ref 1.4–7.7)
Neutrophils Relative %: 56.8 % (ref 43.0–77.0)
Platelets: 277 10*3/uL (ref 150.0–400.0)
RBC: 4.47 Mil/uL (ref 3.87–5.11)
RDW: 11.9 % (ref 11.5–15.5)
WBC: 3.7 10*3/uL — ABNORMAL LOW (ref 4.0–10.5)

## 2021-11-23 LAB — HCG, QUANTITATIVE, PREGNANCY: Quantitative HCG: <0.6 m[IU]/mL

## 2021-11-23 MED ORDER — OMEPRAZOLE 20 MG PO CPDR: 20.0000 mg | DELAYED_RELEASE_CAPSULE | Freq: Every day | ORAL | 3 refills | Status: DC

## 2021-11-23 MED ORDER — DICYCLOMINE HCL 20 MG PO TABS: 20.0000 mg | ORAL_TABLET | Freq: Three times a day (TID) | ORAL | 1 refills | Status: DC

## 2021-11-23 NOTE — Progress Notes (Signed)
° °  Subjective:    Patient ID: Charlotte Garza, female    DOB: 03-Jun-1981, 41 y.o.   MRN: [id number]  HPI GI issues- pt reports discomfort that lasts 'all day'.  'constant bubble guts'.  Pt reports she can hear her bowels throughout the day.  Now having loose stools and feels the need to go the bathroom multiple times/day.  Is having 1-3 BMs daily.  Just recently developed acid reflux.  Some improvement w/ Tums yesterday.  Sxs started ~2 weeks ago.  No recent abx.  No change in medications but is taking Mg 400mg  daily.  Did stop Magnesium in the last week to see if anything changed, but it did not.  No change in diet.  Pt reports work is very stressful.  No N/V.  + bloating.  Pt states no chance of pregnancy.  LMP 11/06/21.  Pt has had IBS symptoms in the past.     Review of Systems For ROS see HPI   This visit occurred during the SARS-CoV-2 public health emergency.  Safety protocols were in place, including screening questions prior to the visit, additional usage of staff PPE, and extensive cleaning of exam room while observing appropriate contact time as indicated for disinfecting solutions.      Objective:   Physical Exam Vitals reviewed.  Constitutional:      General: She is not in acute distress.    Appearance: She is well-developed.  Abdominal:     General: Bowel sounds are normal. There is distension.     Palpations: Abdomen is soft. There is no fluid wave or mass.     Tenderness: There is no abdominal tenderness. There is no rebound.     Hernia: No hernia is present.  Skin:    General: Skin is warm and dry.  Neurological:     General: No focal deficit present.     Mental Status: She is alert.  Psychiatric:        Mood and Affect: Mood normal. Mood is not anxious.        Behavior: Behavior normal.          Assessment & Plan:  Abdominal bloating- new.  Sxs started ~2 weeks ago.  Suspect component of IBS.  Will add Bentyl- scheduled to start and then prn.  Pt expressed  understanding and is in agreement w/ plan.   Loose stools- new.  Pt reports 1-3 BMs daily but having the constant feeling of needing to go.  Suspect this is IBS and pt is internalizing more stress than she realizes.  Encouraged increased fiber in her diet.  Will continue to hold Magnesium as it has laxative properties.  Encourage increased water intake.  Will check labs to r/o infxn or electrolyte disturbance.  GERD- new.  Start low dose acid reducer.  Suspect this is stress related.  Reviewed dietary and lifestyle modifications to assist w/ sx management.

## 2021-11-23 NOTE — Patient Instructions (Signed)
Follow up as needed or as scheduled We'll notify you of your lab results and make any changes if needed Drink LOTS of fluids INCREASE the amount of fiber in your diet- but increase slowly!! START the Dicyclomine 3x/day with meals until things calm down and then use as needed for abdominal cramping START the Omeprazole once daily to decrease acid production Call with any questions or concerns Hang in there!!!

## 2021-11-24 LAB — BASIC METABOLIC PANEL
BUN: 11 mg/dL (ref 6–23)
CO2: 28 mEq/L (ref 19–32)
Calcium: 9.6 mg/dL (ref 8.4–10.5)
Chloride: 104 mEq/L (ref 96–112)
Creatinine, Ser: 0.95 mg/dL (ref 0.40–1.20)
GFR: 75.64 mL/min (ref >60.00)
Glucose, Bld: 86 mg/dL (ref 70–99)
Potassium: 4.1 mEq/L (ref 3.5–5.1)
Sodium: 138 mEq/L (ref 135–145)

## 2021-11-24 LAB — HEPATIC FUNCTION PANEL
ALT: 17 U/L (ref 0–35)
AST: 17 U/L (ref 0–37)
Albumin: 4.4 g/dL (ref 3.5–5.2)
Alkaline Phosphatase: 73 U/L (ref 39–117)
Bilirubin, Direct: 0.1 mg/dL (ref 0.0–0.3)
Total Bilirubin: 0.5 mg/dL (ref 0.2–1.2)
Total Protein: 7.4 g/dL (ref 6.0–8.3)

## 2022-02-14 ENCOUNTER — Telehealth: Payer: Self-pay

## 2022-02-14 NOTE — Telephone Encounter (Signed)
MEDICATION: Generic Lexapro 5 MG ? ?PHARMACY: [facility], [place] - [address] ? ?Comments: Patient states that she is close to running out. States it has been helping with her stomach issues and is wanting to go back on the medication  ? ?**Let patient know to contact pharmacy at the end of the day to make sure medication is ready. ** ? ?** Please notify patient to allow 48-72 hours to process** ? ?**Encourage patient to contact the pharmacy for refills or they can request refills through [facility]** ?  ?

## 2022-02-15 MED ORDER — ESCITALOPRAM OXALATE 5 MG PO TABS: 5.0000 mg | ORAL_TABLET | Freq: Every day | ORAL | 1 refills | Status: DC

## 2022-02-15 NOTE — Telephone Encounter (Signed)
Lexapro prescription sent to pharmacy at pt's request.  If stomach issues don't improve, she should let me know. ?

## 2022-02-15 NOTE — Addendum Note (Signed)
Addended by: Midge Minium on: 02/15/2022 08:32 AM ? ? Modules accepted: Orders ? ?

## 2022-02-15 NOTE — Telephone Encounter (Signed)
Patient aware, states her stomach issues have improved but she will let us know if that changes ?

## 2022-02-15 NOTE — Telephone Encounter (Signed)
Dr Birdie Riddle. I looked in the pt last office visit from Feb and thru the D/C medications. I was unable to find the Lexapro in her medication as current . Can you advise if we can refill this medication ? ?

## 2022-03-08 IMAGING — DX DG SACRUM/COCCYX 2+V
3 series · 3 of 3 positions shown · non-contrast
Comparison: None.

CLINICAL DATA: Low back and sacral pain for 3 months.

EXAM:
SACRUM AND COCCYX - 2+ VIEW

[sacrum ap]
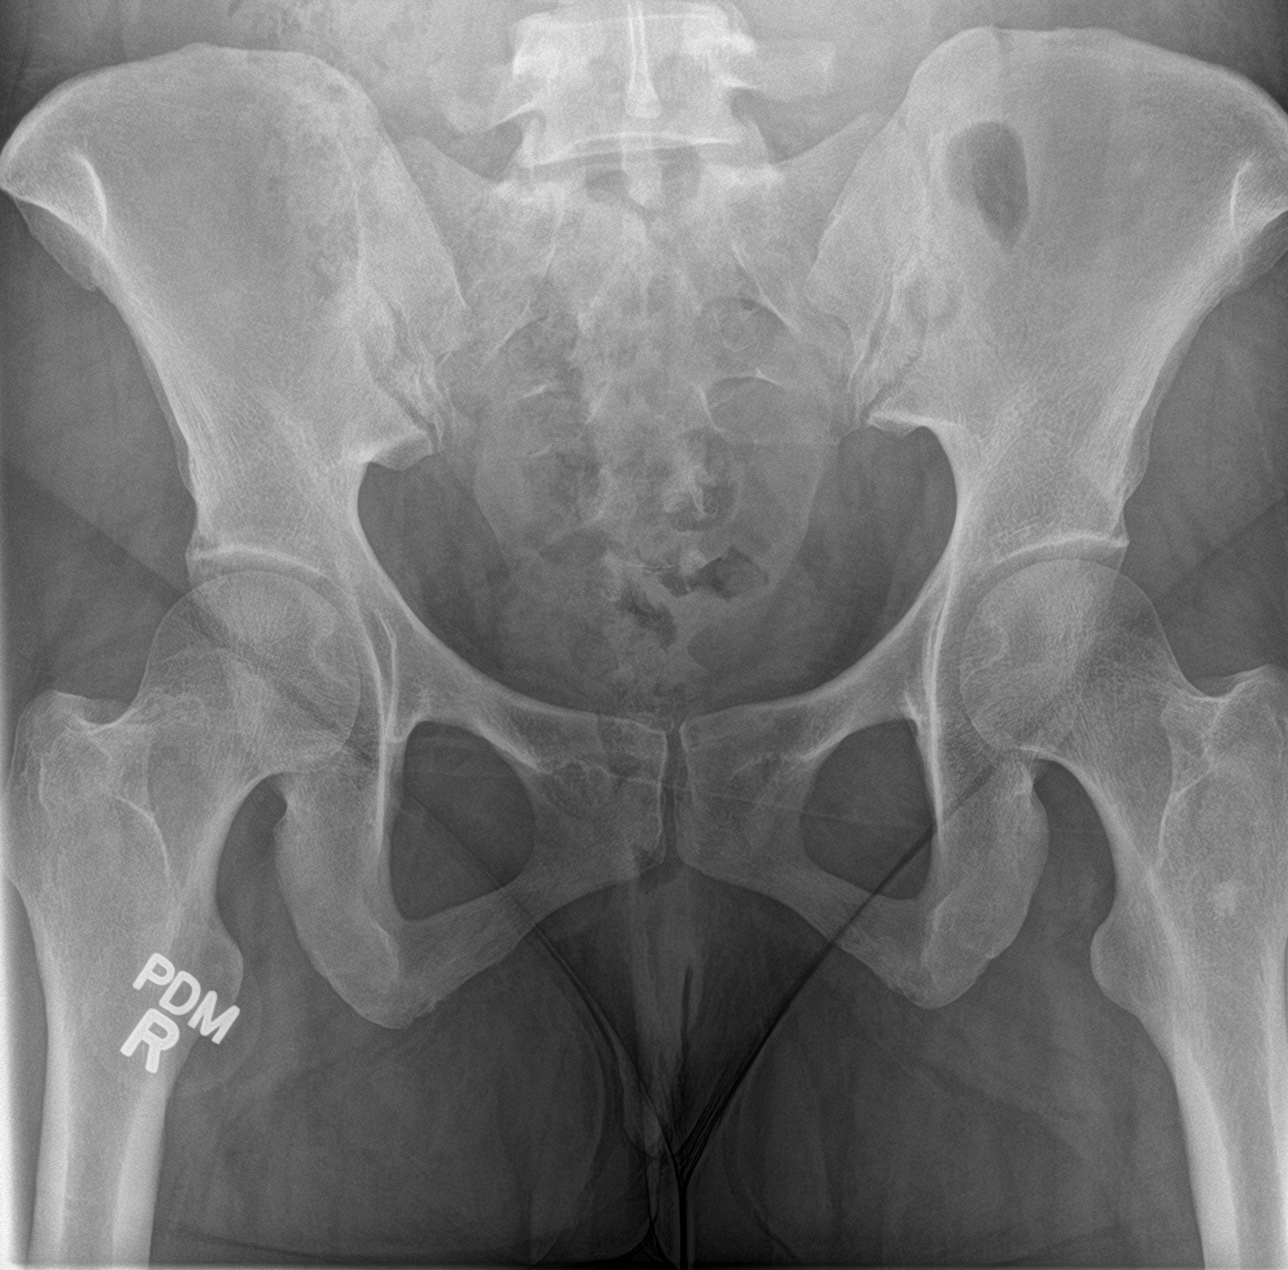

[coccyx ap]
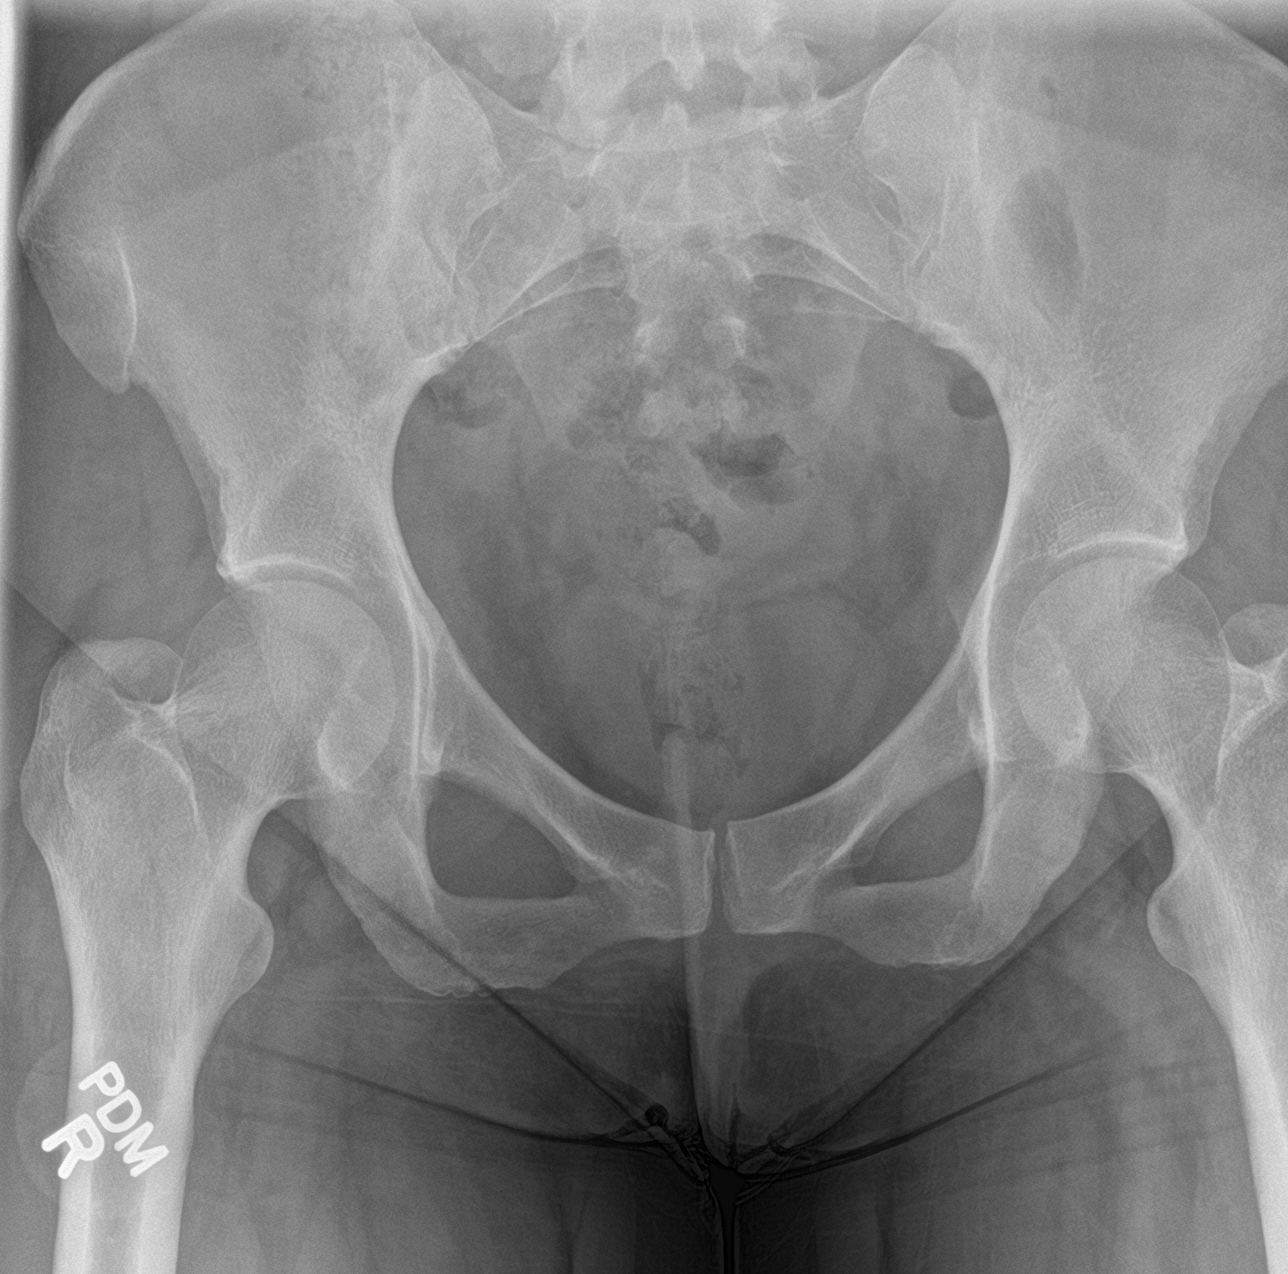

[sacrum lat]
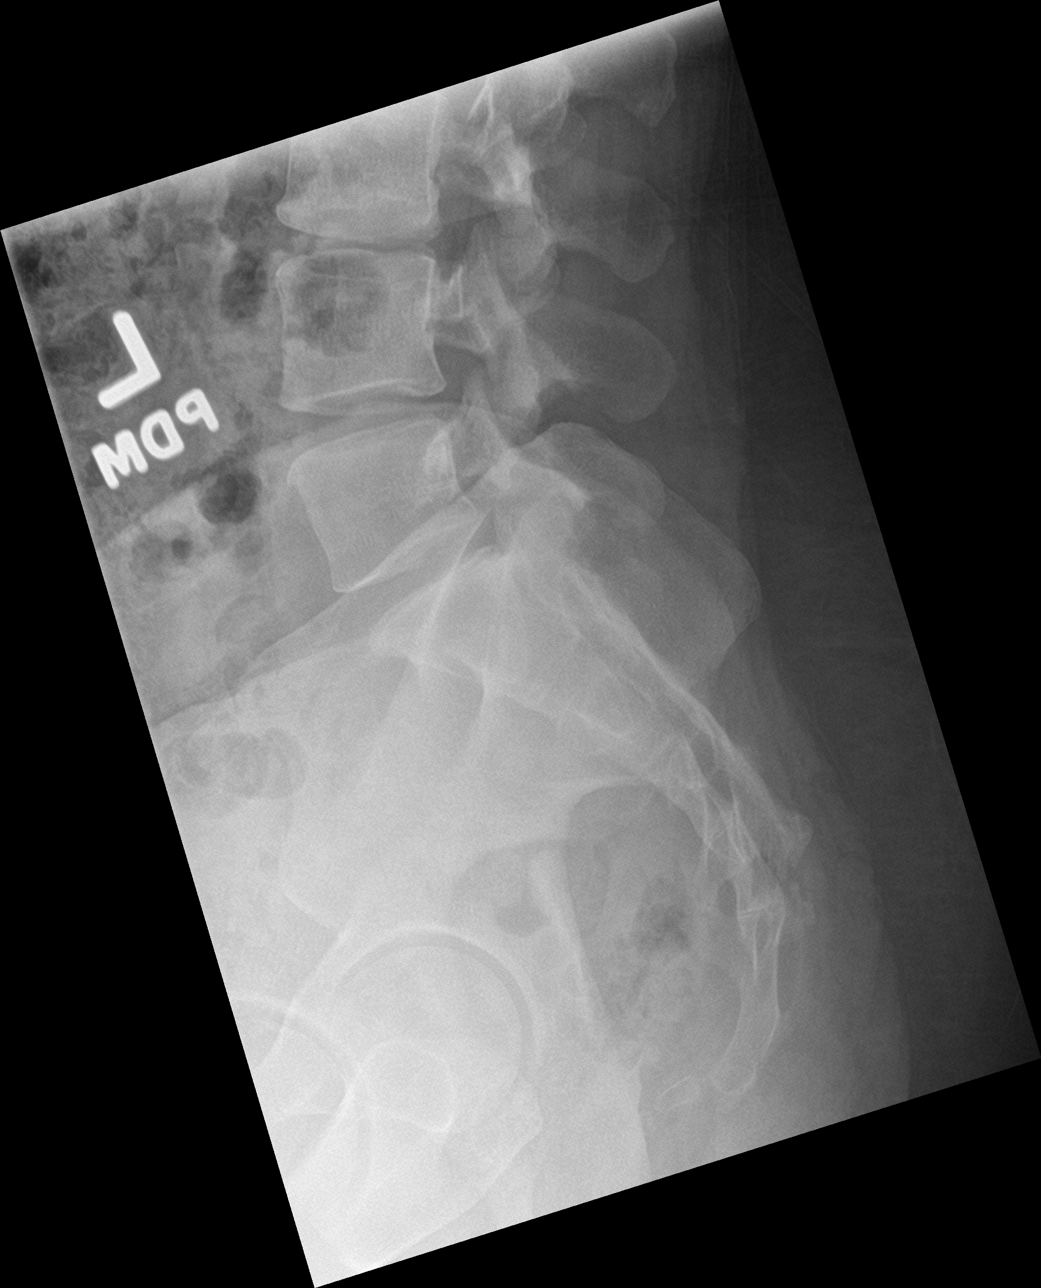

[3 of 3 positions shown; findings below may reference images not displayed]

FINDINGS: There is no evidence of fracture or other focal bone lesions.
IMPRESSION: Normal exam.

## 2022-03-19 ENCOUNTER — Other Ambulatory Visit: Payer: Self-pay | Admitting: Family Medicine

## 2022-09-07 ENCOUNTER — Encounter: Payer: No Typology Code available for payment source | Admitting: Family Medicine

## 2022-10-21 ENCOUNTER — Ambulatory Visit: Payer: No Typology Code available for payment source | Admitting: Family Medicine

## 2022-10-21 ENCOUNTER — Encounter: Payer: Self-pay | Admitting: Family Medicine

## 2022-10-21 VITALS — BP 128/74 | HR 83 | Temp 98.8°F | Resp 16 | Ht 68.0 in | Wt 191.0 lb

## 2022-10-21 DIAGNOSIS — D235 Other benign neoplasm of skin of trunk: Secondary | ICD-10-CM

## 2022-10-21 DIAGNOSIS — Z23 Encounter for immunization: Secondary | ICD-10-CM

## 2022-10-21 DIAGNOSIS — J029 Acute pharyngitis, unspecified: Secondary | ICD-10-CM | POA: Diagnosis not present

## 2022-10-21 NOTE — Progress Notes (Signed)
   Subjective:    Patient ID: Charlotte Garza, female    DOB: [birth year], 42 y.o.   MRN: [id number]  HPI Mole changes- 'it's been there for awhile'.  Pt reports she can now feel it.  Can be painful at times.  No bleeding but does get irritated.  On back.  Sore throat- pt reports sore throat, occurs first thing in the morning and again in the evening.  No fevers.  Able to swallow w/o pain and difficulty during the day.   Review of Systems For ROS see HPI     Objective:   Physical Exam Vitals reviewed.  Constitutional:      General: She is not in acute distress.    Appearance: She is well-developed.  HENT:     Head: Normocephalic and atraumatic.     Right Ear: Tympanic membrane normal.     Left Ear: Tympanic membrane normal.     Nose: Mucosal edema and congestion present. No rhinorrhea.     Right Sinus: No maxillary sinus tenderness or frontal sinus tenderness.     Left Sinus: No maxillary sinus tenderness or frontal sinus tenderness.     Mouth/Throat:     Pharynx: Posterior oropharyngeal erythema (w/ PND) present.  Eyes:     Conjunctiva/sclera: Conjunctivae normal.     Pupils: Pupils are equal, round, and reactive to light.  Cardiovascular:     Rate and Rhythm: Normal rate and regular rhythm.     Heart sounds: Normal heart sounds.  Pulmonary:     Effort: Pulmonary effort is normal. No respiratory distress.     Breath sounds: Normal breath sounds. No wheezing or rales.  Musculoskeletal:     Cervical back: Normal range of motion and neck supple.  Lymphadenopathy:     Cervical: No cervical adenopathy.  Skin:    General: Skin is warm and dry.     Findings: Lesion (hyperpigmented, firm area on R posterior shoulder/upper back) present.  Neurological:     General: No focal deficit present.     Mental Status: She is oriented to person, place, and time.  Psychiatric:        Mood and Affect: Mood normal.        Behavior: Behavior normal.        Thought Content: Thought content  normal.           Assessment & Plan:   Dermatofibroma- new.  Area seems more consistent w/ scar or dermatofibroma than a true mole.  Since this rubs and gets irritated she would like to see Derm to discuss removal.  Referral placed.  Sore throat- new.  Most likely due to combination of PND and dry air.  Encouraged daily antihistamine and using a humidifier at night.  Pt expressed understanding and is in agreement w/ plan.

## 2022-10-21 NOTE — Patient Instructions (Signed)
Follow up as needed or as scheduled We'll call you to schedule your Dermatology appt Drink LOTS of fluids Continue the Allegra daily START using a humidifier to put some moisture in the air Call with any questions or concerns Stay Safe!  Stay Healthy! Happy New Year!!!

## 2022-11-24 ENCOUNTER — Telehealth: Payer: Self-pay

## 2022-11-24 NOTE — Telephone Encounter (Signed)
Received notification that  pt never returned [facility] Dermatology phone call

## 2022-12-01 ENCOUNTER — Encounter: Payer: Self-pay | Admitting: Family Medicine

## 2022-12-01 ENCOUNTER — Ambulatory Visit: Payer: No Typology Code available for payment source | Admitting: Family Medicine

## 2022-12-01 VITALS — BP 120/80 | HR 79 | Temp 98.2°F | Resp 16 | Ht 68.0 in | Wt 192.2 lb

## 2022-12-01 DIAGNOSIS — E663 Overweight: Secondary | ICD-10-CM | POA: Diagnosis not present

## 2022-12-01 DIAGNOSIS — Z0000 Encounter for general adult medical examination without abnormal findings: Secondary | ICD-10-CM

## 2022-12-01 LAB — CBC WITH DIFFERENTIAL/PLATELET
Basophils Absolute: 0 10*3/uL (ref 0.0–0.1)
Basophils Relative: 0.7 % (ref 0.0–3.0)
Eosinophils Absolute: 0.1 10*3/uL (ref 0.0–0.7)
Eosinophils Relative: 1.3 % (ref 0.0–5.0)
HCT: 41 % (ref 36.0–46.0)
Hemoglobin: 13.6 g/dL (ref 12.0–15.0)
Lymphocytes Relative: 24.7 % (ref 12.0–46.0)
Lymphs Abs: 1.2 10*3/uL (ref 0.7–4.0)
MCHC: 33.2 g/dL (ref 30.0–36.0)
MCV: 92.4 fl (ref 78.0–100.0)
Monocytes Absolute: 0.5 10*3/uL (ref 0.1–1.0)
Monocytes Relative: 9.4 % (ref 3.0–12.0)
Neutro Abs: 3.2 10*3/uL (ref 1.4–7.7)
Neutrophils Relative %: 63.9 % (ref 43.0–77.0)
Platelets: 354 10*3/uL (ref 150.0–400.0)
RBC: 4.43 Mil/uL (ref 3.87–5.11)
RDW: 11.6 % (ref 11.5–15.5)
WBC: 4.9 10*3/uL (ref 4.0–10.5)

## 2022-12-01 LAB — BASIC METABOLIC PANEL
BUN: 12 mg/dL (ref 6–23)
CO2: 27 mEq/L (ref 19–32)
Calcium: 9.7 mg/dL (ref 8.4–10.5)
Chloride: 106 mEq/L (ref 96–112)
Creatinine, Ser: 0.88 mg/dL (ref 0.40–1.20)
GFR: 82.32 mL/min (ref >60.00)
Glucose, Bld: 84 mg/dL (ref 70–99)
Potassium: 4.5 mEq/L (ref 3.5–5.1)
Sodium: 140 mEq/L (ref 135–145)

## 2022-12-01 LAB — LIPID PANEL
Cholesterol: 136 mg/dL (ref 0–200)
HDL: 63.6 mg/dL (ref >39.00)
LDL Cholesterol: 58 mg/dL (ref 0–99)
NonHDL: 71.93
Total CHOL/HDL Ratio: 2
Triglycerides: 68 mg/dL (ref 0.0–149.0)
VLDL: 13.6 mg/dL (ref 0.0–40.0)

## 2022-12-01 LAB — HEPATIC FUNCTION PANEL
ALT: 24 U/L (ref 0–35)
AST: 20 U/L (ref 0–37)
Albumin: 4.3 g/dL (ref 3.5–5.2)
Alkaline Phosphatase: 80 U/L (ref 39–117)
Bilirubin, Direct: 0.2 mg/dL (ref 0.0–0.3)
Total Bilirubin: 0.6 mg/dL (ref 0.2–1.2)
Total Protein: 7.3 g/dL (ref 6.0–8.3)

## 2022-12-01 LAB — TSH: TSH: 1.52 u[IU]/mL (ref 0.35–5.50)

## 2022-12-01 NOTE — Progress Notes (Signed)
   Subjective:    Patient ID: Charlotte Garza, female    DOB: 01/12/1981, 42 y.o.   MRN: [id number]  HPI CPE- UTD on pap, Tdap, flu.  No concerns today  Patient Care Team    Relationship Specialty Notifications Start End  Midge Minium, MD PCP - General Family Medicine  06/06/13     Health Maintenance  Topic Date Due   PAP SMEAR-Modifier  11/27/2023   DTaP/Tdap/Td (4 - Td or Tdap) 10/21/2032   INFLUENZA VACCINE  Completed   Hepatitis C Screening  Completed   HIV Screening  Completed   HPV VACCINES  Aged Out   COVID-19 Vaccine  Discontinued      Review of Systems Patient reports no vision/ hearing changes, adenopathy,fever, weight change,  persistant/recurrent hoarseness , swallowing issues, chest pain, palpitations, edema, persistant/recurrent cough, hemoptysis, dyspnea (rest/exertional/paroxysmal nocturnal), gastrointestinal bleeding (melena, rectal bleeding), abdominal pain, significant heartburn, bowel changes, GU symptoms (dysuria, hematuria, incontinence), Gyn symptoms (abnormal  bleeding, pain),  syncope, focal weakness, memory loss, numbness & tingling, skin/hair/nail changes, abnormal bruising or bleeding, anxiety, or depression.     Objective:   Physical Exam General Appearance:    Alert, cooperative, no distress, appears stated age  Head:    Normocephalic, without obvious abnormality, atraumatic  Eyes:    PERRL, conjunctiva/corneas clear, EOM's intact both eyes  Ears:    Normal TM's and external ear canals, both ears  Nose:   Nares normal, septum midline, mucosa normal, no drainage    or sinus tenderness  Throat:   Lips, mucosa, and tongue normal; teeth and gums normal  Neck:   Supple, symmetrical, trachea midline, no adenopathy;    Thyroid: no enlargement/tenderness/nodules  Back:     Symmetric, no curvature, ROM normal, no CVA tenderness  Lungs:     Clear to auscultation bilaterally, respirations unlabored  Chest Wall:    No tenderness or deformity   Heart:     Regular rate and rhythm, S1 and S2 normal, no murmur, rub   or gallop  Breast Exam:    Deferred to GYN  Abdomen:     Soft, non-tender, bowel sounds active all four quadrants,    no masses, no organomegaly  Genitalia:    Deferred to GYN  Rectal:    Extremities:   Extremities normal, atraumatic, no cyanosis or edema  Pulses:   2+ and symmetric all extremities  Skin:   Skin color, texture, turgor normal, no rashes or lesions  Lymph nodes:   Cervical, supraclavicular, and axillary nodes normal  Neurologic:   CNII-XII intact, normal strength, sensation and reflexes    throughout          Assessment & Plan:

## 2022-12-01 NOTE — Patient Instructions (Signed)
Follow up in 1 year or as needed We'll notify you of your lab results and make any changes if needed Continue to work on healthy diet and regular exercise- you're doing great!!! Call with any questions or concerns Stay Safe!! Stay Healthy!!!

## 2022-12-02 ENCOUNTER — Telehealth: Payer: Self-pay

## 2022-12-02 NOTE — Telephone Encounter (Signed)
Left pt a VM with lab results

## 2022-12-02 NOTE — Assessment & Plan Note (Signed)
Pt's PE WNL w/ exception of BMI.  UTD on pap, Tdap, flu.  Check labs.  Anticipatory guidance provided.

## 2022-12-02 NOTE — Telephone Encounter (Signed)
-----   Message from Midge Minium, MD sent at 12/02/2022  7:33 AM EST ----- Labs look great!  No changes at this time

## 2023-02-22 ENCOUNTER — Telehealth: Payer: Self-pay | Admitting: Family Medicine

## 2023-02-22 NOTE — Telephone Encounter (Signed)
Caller name: Cathy Ravert  On DPR?: Yes  Call back number: [phone] (mobile)  Provider they see: Sheliah Hatch, MD  Reason for call:  Pt would like advice tested Covid + on Sunday Feb 19, 2023.

## 2023-02-22 NOTE — Telephone Encounter (Signed)
Pt would like to know what can she take for COVID ? She is congested and coughing . No hx of heart issues ,no asthma dx , no diabetes dx

## 2023-02-22 NOTE — Telephone Encounter (Signed)
I have spoken to the pt and advised of all the OTC medications that are recommended by Dr Beverely Low and she expressed verbal understanding

## 2023-02-22 NOTE — Telephone Encounter (Signed)
Mucinex DM to thin congestion and help suppress cough is helpful.  Vit D, Vit C, and Zinc are all helpful immune boosters.  OTC decongestants like phenylephrine or sudafed can also be helpful

## 2023-04-25 ENCOUNTER — Other Ambulatory Visit: Payer: Self-pay | Admitting: Family Medicine

## 2023-04-25 ENCOUNTER — Other Ambulatory Visit (HOSPITAL_BASED_OUTPATIENT_CLINIC_OR_DEPARTMENT_OTHER): Payer: Self-pay

## 2023-04-25 MED ORDER — TRAZODONE HCL 50 MG PO TABS
25.0000 mg | ORAL_TABLET | Freq: Every evening | ORAL | 1 refills | Status: DC | PRN
  Filled 2023-04-25: qty 30, 30d supply, fill #0
  Filled 2023-08-16: qty 30, 30d supply, fill #1
  Filled 2023-12-29: qty 30, 30d supply, fill #2
  Filled 2024-02-09: qty 30, 30d supply, fill #3

## 2023-04-25 NOTE — Telephone Encounter (Signed)
Patient is requesting a refill of the following medications: Requested Prescriptions   Pending Prescriptions Disp Refills   traZODone (DESYREL) 50 MG tablet 90 tablet 1    Sig: Take 0.5-1 tablets (25-50 mg total) by mouth at bedtime as needed. for sleep    Date of patient request: 04/25/23 Last office visit: 12/01/22 Date of last refill: 03/22/23 Last refill amount: 90x1

## 2023-04-25 NOTE — Telephone Encounter (Signed)
Patient called to see if she can get her trazodone refilled. She states it expired ( last sent to pharmacy on 03/21/2022) and she would like to restart it. I let her know that I would need to get approval from Dr. Beverely Low and she would receive a call once it had been sent in or a decision was made.   Pharmacy: [facility] (on file)

## 2023-04-28 ENCOUNTER — Other Ambulatory Visit (HOSPITAL_BASED_OUTPATIENT_CLINIC_OR_DEPARTMENT_OTHER): Payer: Self-pay

## 2023-06-22 ENCOUNTER — Ambulatory Visit: Payer: No Typology Code available for payment source | Admitting: Family Medicine

## 2023-06-22 ENCOUNTER — Encounter: Payer: Self-pay | Admitting: Family Medicine

## 2023-06-22 ENCOUNTER — Other Ambulatory Visit (HOSPITAL_BASED_OUTPATIENT_CLINIC_OR_DEPARTMENT_OTHER): Payer: Self-pay

## 2023-06-22 VITALS — BP 122/68 | HR 71 | Temp 98.4°F | Resp 17 | Ht 68.0 in | Wt 193.5 lb

## 2023-06-22 DIAGNOSIS — R058 Other specified cough: Secondary | ICD-10-CM | POA: Diagnosis not present

## 2023-06-22 MED ORDER — PREDNISONE 10 MG PO TABS
ORAL_TABLET | ORAL | 0 refills | Status: AC
  Filled 2023-06-22: qty 18, 9d supply, fill #0

## 2023-06-22 MED ORDER — ALBUTEROL SULFATE HFA 108 (90 BASE) MCG/ACT IN AERS
2.0000 | INHALATION_SPRAY | Freq: Four times a day (QID) | RESPIRATORY_TRACT | 0 refills | Status: AC | PRN
  Filled 2023-06-22: qty 6.7, 25d supply, fill #0

## 2023-06-22 NOTE — Patient Instructions (Signed)
Follow up as needed or as scheduled START the Prednisone as directed- 3 pills at the same time x3 days, then 2 pills at the same time x3 days, then 1 pill daily.  Take w/ food  USE the albuterol inhaler as needed for cough or shortness of breath Drink LOTS of fluids Call with any questions or concerns [facility] [place]!!! Hang in there!!!

## 2023-06-22 NOTE — Progress Notes (Signed)
   Subjective:    Patient ID: Charlotte Garza, female    DOB: 1981-08-01, 42 y.o.   MRN: [id number]  HPI Cough- sxs started ~2 weeks ago.  Sxs started ~2 weeks ago.  No change in that time.  Cough is dry, nonproductive.  Denies congestion or postnasal drip.  Pt reports cough is more of an annoyance- triggered by talking, laughing, deep breath.  Cough is not painful.  No fevers.  Pt initially felt like she was coming down w/ something.     Review of Systems For ROS see HPI     Objective:   Physical Exam Vitals reviewed.  Constitutional:      General: She is not in acute distress.    Appearance: Normal appearance. She is not ill-appearing.  HENT:     Head: Normocephalic and atraumatic.     Right Ear: Tympanic membrane and ear canal normal.     Left Ear: Tympanic membrane and ear canal normal.     Nose: Congestion present.     Comments: No TTP over frontal or maxillary sinuses    Mouth/Throat:     Mouth: Mucous membranes are moist.     Pharynx: No oropharyngeal exudate or posterior oropharyngeal erythema.  Eyes:     Extraocular Movements: Extraocular movements intact.     Conjunctiva/sclera: Conjunctivae normal.  Cardiovascular:     Rate and Rhythm: Normal rate and regular rhythm.  Pulmonary:     Effort: Pulmonary effort is normal. No respiratory distress.     Breath sounds: No wheezing or rales.     Comments: Dry cough Musculoskeletal:     Cervical back: Neck supple.  Lymphadenopathy:     Cervical: No cervical adenopathy.  Skin:    General: Skin is warm and dry.  Neurological:     General: No focal deficit present.     Mental Status: She is alert and oriented to person, place, and time.  Psychiatric:        Mood and Affect: Mood normal.        Behavior: Behavior normal.        Thought Content: Thought content normal.           Assessment & Plan:   Post viral cough- new.  Reviewed dx w/ pt and discussed treatment options.  Pt would like to proceed w/ Prednisone taper  as she has a work trip coming up that requires her to present multiple times and right now she can't talk w/o cough.  Start Pred taper.  Albuterol prn.  Reviewed supportive care and red flags that should prompt return.  Pt expressed understanding and is in agreement w/ plan.

## 2023-08-04 ENCOUNTER — Emergency Department (HOSPITAL_BASED_OUTPATIENT_CLINIC_OR_DEPARTMENT_OTHER)
Admission: EM | Admit: 2023-08-04 | Discharge: 2023-08-04 | Disposition: A | Discharge status: Home / Self Care | Payer: No Typology Code available for payment source | Attending: Emergency Medicine | Admitting: Emergency Medicine

## 2023-08-04 ENCOUNTER — Other Ambulatory Visit (HOSPITAL_BASED_OUTPATIENT_CLINIC_OR_DEPARTMENT_OTHER): Payer: Self-pay

## 2023-08-04 ENCOUNTER — Other Ambulatory Visit: Payer: Self-pay

## 2023-08-04 ENCOUNTER — Encounter (HOSPITAL_BASED_OUTPATIENT_CLINIC_OR_DEPARTMENT_OTHER): Payer: Self-pay | Admitting: *Deleted

## 2023-08-04 ENCOUNTER — Ambulatory Visit: Payer: No Typology Code available for payment source | Admitting: Family Medicine

## 2023-08-04 ENCOUNTER — Emergency Department (HOSPITAL_BASED_OUTPATIENT_CLINIC_OR_DEPARTMENT_OTHER): Payer: No Typology Code available for payment source | Admitting: Radiology

## 2023-08-04 DIAGNOSIS — M5441 Lumbago with sciatica, right side: Secondary | ICD-10-CM

## 2023-08-04 DIAGNOSIS — M545 Low back pain, unspecified: Secondary | ICD-10-CM | POA: Insufficient documentation

## 2023-08-04 LAB — PREGNANCY, URINE: Preg Test, Ur: NEGATIVE

## 2023-08-04 MED ORDER — LIDOCAINE 5 % EX PTCH
1.0000 | MEDICATED_PATCH | CUTANEOUS | 0 refills | Status: DC
  Filled 2023-08-04: qty 30, 30d supply, fill #0

## 2023-08-04 MED ORDER — METHOCARBAMOL 500 MG PO TABS
500.0000 mg | ORAL_TABLET | Freq: Two times a day (BID) | ORAL | 0 refills | Status: AC | PRN
  Filled 2023-08-04: qty 20, 10d supply, fill #0

## 2023-08-04 MED ORDER — KETOROLAC TROMETHAMINE 30 MG/ML IJ SOLN
30.0000 mg | Freq: Once | INTRAMUSCULAR | Status: AC
  Administered 2023-08-04: 30 mg via INTRAMUSCULAR
  Filled 2023-08-04: qty 1

## 2023-08-04 MED ORDER — LIDOCAINE 5 % EX PTCH
1.0000 | MEDICATED_PATCH | CUTANEOUS | Status: DC
  Administered 2023-08-04: 1 via TRANSDERMAL
  Filled 2023-08-04: qty 1

## 2023-08-04 MED ORDER — PREDNISONE 10 MG PO TABS
10.0000 mg | ORAL_TABLET | Freq: Every day | ORAL | 0 refills | Status: DC
  Filled 2023-08-04: qty 42, 12d supply, fill #0

## 2023-08-04 NOTE — ED Triage Notes (Signed)
Right lower back with radiation into buttock and thigh, increases with movement, not associated with any trauma

## 2023-08-04 NOTE — ED Provider Notes (Signed)
[place] EMERGENCY DEPARTMENT AT [facility] Provider Note   CSN: [id number] Arrival date & time: 08/04/23  0944     History  Chief Complaint  Patient presents with   Back Pain    Charlotte Garza is a 42 y.o. female.   Back Pain   42 year old female presents emergency department with complaints of right low back/buttock pain with radiation down her right leg.  Patient states the symptoms were present for the past week or so.  States that she has had intermittent radiation down her right leg to her toes although this is currently not happening right now.  States that she woke up about a week ago with a vague symptom of pain in her right low back/buttock region.  States that symptoms have been worsened whenever she has been more active over the past week.  Has been taking ibuprofen, Aleve, Excedrin with some relief of symptoms.  States that she played golf yesterday which acutely worsened symptoms prompting visit to the emergency department today.  Denies any fever, bowel/bladder dysfunction, weakness/sensory deficits in lower extremities, prolonged corticosteroid use, no malignancy, immunocompromised, history of IV drug use.  Denies any abdominal pain, urinary symptoms, change in bowel habits.  States her mother has sciatica that is somewhat debilitating.  Denies history of similar symptoms in the past.  Past medical history significant for syncope, depression  Home Medications Prior to Admission medications   Medication Sig Start Date End Date Taking? Authorizing Provider  ketoconazole (NIZORAL) 2 % shampoo Apply 1 Application topically once a week. 06/20/23  Yes [provider]  lidocaine (LIDODERM) 5 % Place 1 patch onto the skin daily. Remove & Discard patch within 12 hours or as directed by MD 08/04/23  Yes Sherian Maroon A, PA  methocarbamol (ROBAXIN) 500 MG tablet Take 1 tablet (500 mg total) by mouth 2 (two) times daily as needed for muscle spasms. 08/04/23  Yes  Sherian Maroon A, PA  predniSONE (DELTASONE) 10 MG tablet Take by mouth daily. Take 6 tabs by mouth daily for 2 days, then 5 tabs for 2 days, then 4 tabs for 2 days, then 3 tabs for 2 days, 2 tabs for 2 days, then 1 tab by mouth daily for 2 days. 08/04/23  Yes Sherian Maroon A, PA  albuterol (VENTOLIN HFA) 108 (90 Base) MCG/ACT inhaler Inhale 2 puffs into the lungs every 6 (six) hours as needed for wheezing or shortness of breath. 06/22/23   Sheliah Hatch, MD  fexofenadine (ALLEGRA) 60 MG tablet Take 60 mg by mouth 2 (two) times daily.    [provider]  traZODone (DESYREL) 50 MG tablet Take 1/2-1 tablet (25-50 mg total) by mouth at bedtime as needed for sleep 04/25/23   Sheliah Hatch, MD      Allergies    Patient has no known allergies.    Review of Systems   Review of Systems  Musculoskeletal:  Positive for back pain.  All other systems reviewed and are negative.   Physical Exam Updated Vital Signs BP (!) 137/90 (BP Location: Right Arm)   Pulse 81   Temp 98.2 F (36.8 C) (Oral)   Resp 16   LMP 07/30/2023   SpO2 99%  Physical Exam Vitals and nursing note reviewed.  Constitutional:      General: She is not in acute distress.    Appearance: She is well-developed.  HENT:     Head: Normocephalic and atraumatic.  Eyes:     Conjunctiva/sclera: Conjunctivae normal.  Cardiovascular:     Rate and Rhythm: Normal rate and regular rhythm.     Heart sounds: No murmur heard. Pulmonary:     Effort: Pulmonary effort is normal. No respiratory distress.     Breath sounds: Normal breath sounds.  Abdominal:     Palpations: Abdomen is soft.     Tenderness: There is no abdominal tenderness.  Musculoskeletal:        General: No swelling.     Cervical back: Neck supple.     Comments: No midline tenderness of lumbar spine.  Paraspinal tenderness in the right lower lumbar region as well as on right buttock.  Muscular strength 5 out of 5 for bilateral hip flexion/extension,  knee flexion/extension, ankle dorsi/plantarflexion.  No sensory deficits along major nerve distributions of lower extremities.  Pedal pulses 2+ bilaterally.  DTR symmetric at patella.  Straight leg raise negative on the left.  Sure glaze on right reproduces pain radiating down to posterior right leg to popliteal fossa.  Skin:    General: Skin is warm and dry.     Capillary Refill: Capillary refill takes less than 2 seconds.  Neurological:     Mental Status: She is alert.  Psychiatric:        Mood and Affect: Mood normal.     ED Results / Procedures / Treatments   Labs (all labs ordered are listed, but only abnormal results are displayed) Labs Reviewed  PREGNANCY, URINE    EKG None  Radiology DG Lumbar Spine Complete  Result Date: 08/04/2023 CLINICAL DATA:  Right lower back pain with radiation into buttocks and thigh, which increases with movement EXAM: LUMBAR SPINE - COMPLETE 4+ VIEW COMPARISON:  08/05/2020 FINDINGS: Five lumbar type vertebral bodies. There is no evidence of lumbar spine fracture. No listhesis. Intervertebral disc spaces are maintained. 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 Electronically Signed   By: Wiliam Ke M.D.   On: 08/04/2023 12:32    Procedures Procedures    Medications Ordered in ED Medications  lidocaine (LIDODERM) 5 % 1 patch (1 patch Transdermal Patch Applied 08/04/23 1022)  ketorolac (TORADOL) 30 MG/ML injection 30 mg (30 mg Intramuscular Given 08/04/23 1022)    ED Course/ Medical Decision Making/ A&P                                 Medical Decision Making Amount and/or Complexity of Data Reviewed Labs: ordered. Radiology: ordered.  Risk Prescription drug management.   This patient presents to the ED for concern of back pain, this involves an extensive number of treatment options, and is a complaint that carries with it a high risk of complications and morbidity.  The differential diagnosis includes  fracture, strain chest pain, dislocation, sciatica, AAA, aortic dissection, pyelonephritis, nephrolithiasis, cauda equina, spinal epidural abscess, other   Co morbidities that complicate the patient evaluation  See HPI   Additional history obtained:  Additional history obtained from EMR External records from outside source obtained and reviewed including hospital records   Lab Tests:  I Ordered, and personally interpreted labs.  The pertinent results include: Urine pregnancy which is negative   Imaging Studies ordered:  I ordered imaging studies including lumbar spine x-ray I independently visualized and interpreted imaging which showed no acute abnormalities.  Facet arthropathy I agree with the radiologist interpretation   Cardiac Monitoring: / EKG:  The patient was maintained on a cardiac monitor.  I  personally viewed and interpreted the cardiac monitored which showed an underlying rhythm of: Sinus rhythm   Consultations Obtained:  N/a   Problem List / ED Course / Critical interventions / Medication management  Sciatica I ordered medication including Toradol, Lidoderm   Reevaluation of the patient after these medicines showed that the patient improved I have reviewed the patients home medicines and have made adjustments as needed   Social Determinants of Health:  Denies tobacco, licit drug use   Test / Admission - Considered:  Sciatica Vitals signs within normal range and stable throughout visit. Laboratory/imaging studies significant for: See above 42 year old female presents emergency department with complaints of right low back pain with radiation down right leg.  Per HPI/PE, no red flags for back pain concerning for spinal cord compression/impingement/cauda equina.  Patient without abdominal pain rating to back, pulse deficits, hypertension; low suspicion for aortic dissection.  Patient without urinary symptoms, CVA tenderness; low suspicion for  pyelonephritis/nephrolithiasis.  Patient without traumatic injury concerning for fracture or dislocation.  X-ray without any acute abnormalities.  Suspect the patient's symptoms are likely secondary to right-sided sciatica.  Will treat with rest, ice, NSAIDs as well as physical therapy in the outpatient setting.  Will recommend close follow-up with primary care in the outpatient setting for reassessment.  Treatment plan discussed at length with patient and she acknowledged understanding was agreeable to said plan.  Patient overall well-appearing, afebrile in no acute distress. Worrisome signs and symptoms were discussed with the patient, and the patient acknowledged understanding to return to the ED if noticed. Patient was stable upon discharge.          Final Clinical Impression(s) / ED Diagnoses Final diagnoses:  Acute right-sided low back pain with right-sided sciatica    Rx / DC Orders ED Discharge Orders     None         Peter Garter, [place] 08/04/23 1347    Alvira Monday, MD 08/07/23 [phone]

## 2023-08-04 NOTE — Discharge Instructions (Addendum)
As discussed, workup today overall reassuring.  X-ray did show signs of something called facet arthropathy or arthritis and small connections between your L5 vertebrae and your S1 vertebrae.  I suspect your symptoms are likely secondary to sciatica.  Will treat with prednisone taper as well as muscle relaxer to use as needed.  Muscle laxer can cause drowsiness so please do not drive or perform any high risk activity and to you realize its medications effects on you.  Information attached regarding rehabilitation exercises.  Will send in PT referral given family history of sciatica as well as current flareup.  I feel like you would benefit most from physical therapy to decrease likelihood of recurrence as well as establishing early treatment if you notice it recur.  Please do not hesitate to return to emergency department if there are worrisome signs and symptoms we discussed become apparent.

## 2023-09-11 ENCOUNTER — Ambulatory Visit (HOSPITAL_BASED_OUTPATIENT_CLINIC_OR_DEPARTMENT_OTHER): Payer: No Typology Code available for payment source | Attending: Family Medicine | Admitting: Physical Therapy

## 2023-09-11 ENCOUNTER — Encounter (HOSPITAL_BASED_OUTPATIENT_CLINIC_OR_DEPARTMENT_OTHER): Payer: Self-pay | Admitting: Physical Therapy

## 2023-09-11 ENCOUNTER — Other Ambulatory Visit: Payer: Self-pay

## 2023-09-11 DIAGNOSIS — M5416 Radiculopathy, lumbar region: Secondary | ICD-10-CM | POA: Insufficient documentation

## 2023-09-11 DIAGNOSIS — M5459 Other low back pain: Secondary | ICD-10-CM | POA: Diagnosis present

## 2023-09-11 NOTE — Therapy (Signed)
OUTPATIENT PHYSICAL THERAPY THORACOLUMBAR EVALUATION   Patient Name: Charlotte Garza MRN: [id number] DOB:04-Mar-1981, 42 y.o., female Today's Date: 09/11/2023  END OF SESSION:   Past Medical History:  Diagnosis Date   Chicken pox    Depression    Syncope    No past surgical history on file. Patient Active Problem List   Diagnosis Date Noted   Trapezius strain 06/05/2014   Routine general medical examination at a health care facility 06/06/2013    PCP: Tish Men MD  REFERRING PROVIDER:  Sherian Maroon PA   REFERRING DIAG:   Rationale for Evaluation and Treatment: Rehabilitation  THERAPY DIAG:  No diagnosis found.  ONSET DATE: 08/03/2023  SUBJECTIVE:                                                                                                                                                                                           SUBJECTIVE STATEMENT: Patient is a 42 year old female with acute onset of low back pain around 07/30/2023.  She has a history of low back pain but is usually intermittent and not severe.  The patient had severe pain with an insidious onset this episode.  She was given prednisone and muscle relaxers.  This helped her pain significantly.  At this time she is only having mild pain.  She can feel when she is doing housework and when she is standing or walking for too long.  On the onset of pain she had pain radiating into her buttocks.  At this time the pain is more centralized in her low back.  PERTINENT HISTORY:  Syncope,   PAIN:  Are you having pain? Yes: NPRS scale1 /10.  Frequent severe level in the past few days Pain location: Right-sided low back Pain description: Aching Aggravating factors: doing thisngs around the house  Relieving factors: Rest muscle relaxers  PRECAUTIONS: None  RED FLAGS: None   WEIGHT BEARING RESTRICTIONS: No  FALLS:  Has patient fallen in last 6 months? No  LIVING ENVIRONMENT: Things  significant  OCCUPATION:  [employer]  Hobbies/recreation:   Chemical engineer   PLOF: Independent  PATIENT GOALS:  To have less pain    NEXT MD VISIT: Nothing scheduled per patient mild arthritis  OBJECTIVE:  Note: Objective measures were completed at Evaluation unless otherwise noted.  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  PATIENT SURVEYS:  FOTO    SCREENING FOR RED FLAGS: Bowel or bladder incontinence: No Spinal tumors: No Cauda equina syndrome: No Compression fracture: No Abdominal aneurysm: No  COGNITION: Overall cognitive status: Within functional limits for tasks assessed  SENSATION: WFL  MUSCLE LENGTH:  POSTURE: No Significant postural limitations  PALPATION:   LUMBAR ROM:   AROM eval  Flexion No pain  Extension No pain  Right lateral flexion No pain  Left lateral flexion No pain  Right rotation No pain  Left rotation And on the right   (Blank rows = not tested)  LOWER EXTREMITY ROM:     Active  Right eval Left eval  Hip flexion Full Full  Hip extension    Hip abduction    Hip adduction    Hip internal rotation    Hip external rotation    Knee flexion Full Full  Knee extension Full Full  Ankle dorsiflexion    Ankle plantarflexion    Ankle inversion    Ankle eversion     (Blank rows = not tested)  LOWER EXTREMITY MMT:    MMT Right eval Left eval  Hip flexion 36.3 33.4  Hip extension    Hip abduction 30.8 34.8  Hip adduction    Hip internal rotation    Hip external rotation    Knee flexion    Knee extension 41.1 42.1  Ankle dorsiflexion    Ankle plantarflexion    Ankle inversion    Ankle eversion     (Blank rows = not tested)   GAIT: Normal gait TODAY'S TREATMENT:                                                                                                                              DATE:   Access Code: [email or web address] URL: [email or web address] Date:  09/11/2023 Prepared by: Lorayne Bender  Program Notes Lie on Stomach. If it feels better keep doing.   Exercises - Supine Piriformis Stretch with Foot on Ground  - 1 x daily - 7 x weekly - 3 sets - 3 reps - 20 sec hold  hold - Standing Glute Med Mobilization with Small Ball on Wall  - 1 x daily - 7 x weekly - 3 sets - 10 reps - Supine Bridge with Resistance Band  - 1 x daily - 7 x weekly - 3 sets - 10 reps - Hooklying Clamshell with Resistance  - 1 x daily - 7 x weekly - 3 sets - 10 reps - Shoulder extension with resistance - Neutral  - 1 x daily - 7 x weekly - 3 sets - 10 reps - Scapular Retraction with Resistance  - 1 x daily - 7 x weekly - 3 sets - 10 reps   PATIENT EDUCATION:  Education details:HEP, symptom management  Person educated: Patient Education method: Explanation, Demonstration, Tactile cues, Verbal cues, and Handouts Education comprehension: verbalized understanding, returned demonstration, verbal cues required, tactile cues required, and needs further education  HOME EXERCISE PROGRAM: Access Code: [email or web address] URL: [email or web address] Date: 09/12/2023 Prepared by: Lorayne Bender  Program Notes Lie on Stomach. If it feels better keep doing.   Exercises - Supine Piriformis Stretch with Foot on Ground  -  1 x daily - 7 x weekly - 3 sets - 3 reps - 20 sec hold  hold - Standing Glute Med Mobilization with Small Ball on Wall  - 1 x daily - 7 x weekly - 3 sets - 10 reps - Supine Bridge with Resistance Band  - 1 x daily - 7 x weekly - 3 sets - 10 reps - Hooklying Clamshell with Resistance  - 1 x daily - 7 x weekly - 3 sets - 10 reps - Shoulder extension with resistance - Neutral  - 1 x daily - 7 x weekly - 3 sets - 10 reps - Scapular Retraction with Resistance  - 1 x daily - 7 x weekly - 3 sets - 10 reps  ASSESSMENT:  CLINICAL IMPRESSION: Patient is a 42 year old female with acute onset of right-sided low back pain radiating to her buttocks.  At this time the  pain is resolved.  She still has pain in her low back when she does activity.  She has a trigger point in around L4-L5 and her lumbar multifidus.  She has mild pain with left rotation on the right side.  We gave her a program with some stretches she can try after her acute pain returns.  We reviewed use of a tennis ball to reduce trigger points.  She is also given a base exercise program.  Next visit will review base exercise program to see how she responded.  If she is doing well we will repeat return to gym exercises.  OBJECTIVE IMPAIRMENTS: decreased activity tolerance, increased fascial restrictions, and pain.   ACTIVITY LIMITATIONS: bending, standing, squatting, and locomotion level  PARTICIPATION LIMITATIONS: meal prep, cleaning, laundry, community activity, and occupation  PERSONAL FACTORS: None   REHAB POTENTIAL: Excellent  CLINICAL DECISION MAKING: Stable/uncomplicated  EVALUATION COMPLEXITY: Low   GOALS: Goals reviewed with patient? Yes  SHORT TERM GOALS: Target date: 10/09/2023    Patient will demonstrate equal hip abduction strength  Baseline: Goal status: INITIAL  2.  Patient will report a 75% reduction in tenderness to trigger point  Baseline:  Goal status: INITIAL  3.  Patient will be independent with HEP  Baseline:  Goal status: INITIAL   LONG TERM GOALS: Target date: 11/06/2023     The patient will return to full gym program  Baseline:  Goal status: INITIAL  2.  The patient will perform all house work and activity without pain  Baseline:  Goal status: INITIAL   PLAN:  PT FREQUENCY: 1x/week  PT DURATION: 8 weeks  PLANNED INTERVENTIONS: 97110-Therapeutic exercises, 97530- Therapeutic activity, [id number]- Neuromuscular re-education, 97535- Self Care, 21308- Manual therapy,  [id number]- Aquatic Therapy, 97014- Electrical stimulation (unattended), 97035- Ultrasound, Patient/Family education, Taping, Dry Needling, DME instructions, Cryotherapy, and Moist heat  .  PLAN FOR NEXT SESSION:   If patient remains asymptomatic review gym exercises. Consider reviewing prayer press and hamstring stress and prayer stretch. If patient is in pain consider manual therapy and needling.    Dessie Coma, PT 09/11/2023, 8:07 AM

## 2023-09-12 ENCOUNTER — Encounter (HOSPITAL_BASED_OUTPATIENT_CLINIC_OR_DEPARTMENT_OTHER): Payer: Self-pay | Admitting: Physical Therapy

## 2023-09-26 ENCOUNTER — Encounter (HOSPITAL_BASED_OUTPATIENT_CLINIC_OR_DEPARTMENT_OTHER): Payer: Self-pay | Admitting: Physical Therapy

## 2023-09-26 ENCOUNTER — Ambulatory Visit (HOSPITAL_BASED_OUTPATIENT_CLINIC_OR_DEPARTMENT_OTHER): Payer: No Typology Code available for payment source | Admitting: Physical Therapy

## 2023-09-26 DIAGNOSIS — M5459 Other low back pain: Secondary | ICD-10-CM

## 2023-09-26 DIAGNOSIS — M5416 Radiculopathy, lumbar region: Secondary | ICD-10-CM

## 2023-09-26 NOTE — Therapy (Signed)
OUTPATIENT PHYSICAL THERAPY THORACOLUMBAR EVALUATION   Patient Name: Charlotte Garza MRN: [id number] DOB:05-Oct-1981, 42 y.o., female Today's Date: 09/26/2023  END OF SESSION:  PT End of Session - 09/26/23 0849     Visit Number 2    Number of Visits 8    Date for PT Re-Evaluation 11/07/23    PT Start Time 0817    PT Stop Time 0845    PT Time Calculation (min) 28 min    Activity Tolerance Patient tolerated treatment well    Behavior During Therapy [facility] for tasks assessed/performed             Past Medical History:  Diagnosis Date   Chicken pox    Depression    Syncope    History reviewed. No pertinent surgical history. Patient Active Problem List   Diagnosis Date Noted   Trapezius strain 06/05/2014   Routine general medical examination at a health care facility 06/06/2013    PCP: Tish Men MD  REFERRING PROVIDER:  Sherian Maroon PA   REFERRING DIAG:   Rationale for Evaluation and Treatment: Rehabilitation  THERAPY DIAG:  Radiculopathy, lumbar region  Other low back pain  ONSET DATE: 08/03/2023  SUBJECTIVE:                                                                                                                                                                                           SUBJECTIVE STATEMENT:  Pt states she has not had any pain since last session. She has mild R sided aching ocassionally. Has been doing bands and tennis ball massage but not he bridging.    Eval:  Patient is a 42 year old female with acute onset of low back pain around 07/30/2023.  She has a history of low back pain but is usually intermittent and not severe.  The patient had severe pain with an insidious onset this episode.  She was given prednisone and muscle relaxers.  This helped her pain significantly.  At this time she is only having mild pain.  She can feel when she is doing housework and when she is standing or walking for too long.  On the onset of pain she had  pain radiating into her buttocks.  At this time the pain is more centralized in her low back.  PERTINENT HISTORY:  Syncope,   PAIN:  Are you having pain? Yes: NPRS scale 2 /10.  Frequent severe level in the past few days Pain location: Right-sided low back Pain description: Aching Aggravating factors: doing thisngs around the house  Relieving factors: Rest muscle relaxers  PRECAUTIONS: None  RED FLAGS: None   WEIGHT BEARING  RESTRICTIONS: No  FALLS:  Has patient fallen in last 6 months? No  LIVING ENVIRONMENT: Things significant  OCCUPATION:  [employer]  Hobbies/recreation:   Chemical engineer   PLOF: Independent  PATIENT GOALS:  To have less pain    NEXT MD VISIT: Nothing scheduled per patient mild arthritis  OBJECTIVE:  Note: Objective measures were completed at Evaluation unless otherwise noted.  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  PATIENT SURVEYS:  FOTO    SCREENING FOR RED FLAGS: Bowel or bladder incontinence: No Spinal tumors: No Cauda equina syndrome: No Compression fracture: No Abdominal aneurysm: No  COGNITION: Overall cognitive status: Within functional limits for tasks assessed     SENSATION: WFL  MUSCLE LENGTH:  POSTURE: No Significant postural limitations  PALPATION:   LUMBAR ROM:   AROM eval  Flexion No pain  Extension No pain  Right lateral flexion No pain  Left lateral flexion No pain  Right rotation No pain  Left rotation And on the right   (Blank rows = not tested)  LOWER EXTREMITY ROM:     Active  Right eval Left eval  Hip flexion Full Full  Hip extension    Hip abduction    Hip adduction    Hip internal rotation    Hip external rotation    Knee flexion Full Full  Knee extension Full Full  Ankle dorsiflexion    Ankle plantarflexion    Ankle inversion    Ankle eversion     (Blank rows = not tested)  LOWER EXTREMITY MMT:    MMT Right eval Left eval   Hip flexion 36.3 33.4  Hip extension    Hip abduction 30.8 34.8  Hip adduction    Hip internal rotation    Hip external rotation    Knee flexion    Knee extension 41.1 42.1  Ankle dorsiflexion    Ankle plantarflexion    Ankle inversion    Ankle eversion     (Blank rows = not tested)   GAIT: Normal gait TODAY'S TREATMENT:                                                                                                                              DATE:   12/17   Exercises - Child's Pose with Thread the Needle   10 reps - 3 hold -- Supine Bridge with Green Resistance Band  -1 sets - 20 reps - 2 hold - Cable Shoulder extension 1 x daily -- 3 sets - 10 reps 20lbs [employer] -  2 sets - 10 reps (unilateral cable row) 20lbs  Eval:  Program Notes Lie on Stomach. If it feels better keep doing.   Exercises - Supine Piriformis Stretch with Foot on Ground  - 1 x daily - 7 x weekly - 3 sets - 3 reps - 20 sec hold  hold - Standing Glute Med Mobilization with  Small Ball on Wall  - 1 x daily - 7 x weekly - 3 sets - 10 reps - Supine Bridge with Resistance Band  - 1 x daily - 7 x weekly - 3 sets - 10 reps - Hooklying Clamshell with Resistance  - 1 x daily - 7 x weekly - 3 sets - 10 reps - Shoulder extension with resistance - Neutral  - 1 x daily - 7 x weekly - 3 sets - 10 reps - Scapular Retraction with Resistance  - 1 x daily - 7 x weekly - 3 sets - 10 reps   PATIENT EDUCATION:  Education details:HEP, symptom management  Person educated: Patient Education method: Explanation, Demonstration, Tactile cues, Verbal cues, and Handouts Education comprehension: verbalized understanding, returned demonstration, verbal cues required, tactile cues required, and needs further education  HOME EXERCISE PROGRAM: Access Code: [email or web address] URL: [email or web address] Date: 09/12/2023 Prepared by: Lorayne Bender  Program Notes Lie on Stomach. If it feels better keep doing.    Exercises - Supine Piriformis Stretch with Foot on Ground  - 1 x daily - 7 x weekly - 3 sets - 3 reps - 20 sec hold  hold - Standing Glute Med Mobilization with Small Ball on Wall  - 1 x daily - 7 x weekly - 3 sets - 10 reps - Supine Bridge with Resistance Band  - 1 x daily - 7 x weekly - 3 sets - 10 reps - Hooklying Clamshell with Resistance  - 1 x daily - 7 x weekly - 3 sets - 10 reps - Shoulder extension with resistance - Neutral  - 1 x daily - 7 x weekly - 3 sets - 10 reps - Scapular Retraction with Resistance  - 1 x daily - 7 x weekly - 3 sets - 10 reps  ASSESSMENT:  CLINICAL IMPRESSION: Pt session limited due to arrival time. However, pt able to progress to gym exercise today without pain. Mobility exercise provided for daily performance and strengthening progressed in the gym setting. Pt responded well to cuing for proper abdominal bracing with lifting. Plan to revisit squat technique  with proper abdominal bracing at next visit as pt tends to sit in exaggerated lordosis with most exercise. Continue with strength as tolerated.   OBJECTIVE IMPAIRMENTS: decreased activity tolerance, increased fascial restrictions, and pain.   ACTIVITY LIMITATIONS: bending, standing, squatting, and locomotion level  PARTICIPATION LIMITATIONS: meal prep, cleaning, laundry, community activity, and occupation  PERSONAL FACTORS: None   REHAB POTENTIAL: Excellent  CLINICAL DECISION MAKING: Stable/uncomplicated  EVALUATION COMPLEXITY: Low   GOALS: Goals reviewed with patient? Yes  SHORT TERM GOALS: Target date: 10/09/2023    Patient will demonstrate equal hip abduction strength  Baseline: Goal status: INITIAL  2.  Patient will report a 75% reduction in tenderness to trigger point  Baseline:  Goal status: INITIAL  3.  Patient will be independent with HEP  Baseline:  Goal status: INITIAL   LONG TERM GOALS: Target date: 11/06/2023     The patient will return to full gym program   Baseline:  Goal status: INITIAL  2.  The patient will perform all house work and activity without pain  Baseline:  Goal status: INITIAL   PLAN:  PT FREQUENCY: 1x/week  PT DURATION: 8 weeks  PLANNED INTERVENTIONS: 97110-Therapeutic exercises, 97530- Therapeutic activity, [id number]- Neuromuscular re-education, 97535- Self Care, [id number]- Manual therapy,  [id number]- Aquatic Therapy, 97014- Electrical stimulation (unattended), 97035- Ultrasound, Patient/Family education, Taping, Dry Needling, DME instructions, Cryotherapy, and Moist heat .  PLAN FOR NEXT SESSION:   If patient remains asymptomatic review gym exercises. Consider reviewing prayer press and hamstring stress and prayer stretch. If patient is in pain consider manual therapy and needling.    Zebedee Iba, PT 09/26/2023, 8:49 AM

## 2023-10-09 ENCOUNTER — Other Ambulatory Visit (HOSPITAL_BASED_OUTPATIENT_CLINIC_OR_DEPARTMENT_OTHER): Payer: Self-pay

## 2023-10-09 MED ORDER — BENZONATATE 200 MG PO CAPS
200.0000 mg | ORAL_CAPSULE | Freq: Three times a day (TID) | ORAL | 0 refills | Status: DC
  Filled 2023-10-09: qty 20, 7d supply, fill #0

## 2023-10-09 MED ORDER — PROMETHAZINE-DM 6.25-15 MG/5ML PO SYRP
5.0000 mL | ORAL_SOLUTION | Freq: Every evening | ORAL | 0 refills | Status: DC | PRN
  Filled 2023-10-09: qty 118, 23d supply, fill #0

## 2023-10-13 ENCOUNTER — Encounter (HOSPITAL_BASED_OUTPATIENT_CLINIC_OR_DEPARTMENT_OTHER): Payer: Self-pay

## 2023-10-13 ENCOUNTER — Ambulatory Visit (HOSPITAL_BASED_OUTPATIENT_CLINIC_OR_DEPARTMENT_OTHER): Payer: No Typology Code available for payment source | Attending: Family Medicine

## 2023-10-13 DIAGNOSIS — M5459 Other low back pain: Secondary | ICD-10-CM | POA: Diagnosis present

## 2023-10-13 DIAGNOSIS — M5416 Radiculopathy, lumbar region: Secondary | ICD-10-CM | POA: Insufficient documentation

## 2023-10-13 NOTE — Therapy (Signed)
 OUTPATIENT PHYSICAL THERAPY THORACOLUMBAR EVALUATION   Patient Name: Charlotte Garza MRN: [id number] DOB:May 15, 1981, 43 y.o., female Today's Date: 10/13/2023  END OF SESSION:  PT End of Session - 10/13/23 0759     Visit Number 3    Number of Visits 8    Date for PT Re-Evaluation 11/07/23    PT Start Time 0802    PT Stop Time 0845    PT Time Calculation (min) 43 min    Activity Tolerance Patient tolerated treatment well    Behavior During Therapy [facility] for tasks assessed/performed              Past Medical History:  Diagnosis Date   Chicken pox    Depression    Syncope    History reviewed. No pertinent surgical history. Patient Active Problem List   Diagnosis Date Noted   Trapezius strain 06/05/2014   Routine general medical examination at a health care facility 06/06/2013    PCP: Kathrine Tabori MD  REFERRING PROVIDER:  Wonda Simpers PA   REFERRING DIAG:   Rationale for Evaluation and Treatment: Rehabilitation  THERAPY DIAG:  Radiculopathy, lumbar region  Other low back pain  ONSET DATE: 08/03/2023  SUBJECTIVE:                                                                                                                                                                                           SUBJECTIVE STATEMENT:  Pt reports only mild increase in discomfort after putting away Christmas decorations.    Eval:  Patient is a 43 year old female with acute onset of low back pain around 07/30/2023.  She has a history of low back pain but is usually intermittent and not severe.  The patient had severe pain with an insidious onset this episode.  She was given prednisone  and muscle relaxers.  This helped her pain significantly.  At this time she is only having mild pain.  She can feel when she is doing housework and when she is standing or walking for too long.  On the onset of pain she had pain radiating into her buttocks.  At this time the pain is more centralized in  her low back.  PERTINENT HISTORY:  Syncope,   PAIN:  Are you having pain? Yes: NPRS scale 2 /10.  Frequent severe level in the past few days Pain location: Right-sided low back Pain description: Aching Aggravating factors: doing thisngs around the house  Relieving factors: Rest muscle relaxers  PRECAUTIONS: None  RED FLAGS: None   WEIGHT BEARING RESTRICTIONS: No  FALLS:  Has patient fallen in last 6 months? No  LIVING ENVIRONMENT: Things  significant  OCCUPATION:  [employer]  Hobbies/recreation:   Chemical Engineer   PLOF: Independent  PATIENT GOALS:  To have less pain    NEXT MD VISIT: Nothing scheduled per patient mild arthritis  OBJECTIVE:  Note: Objective measures were completed at Evaluation unless otherwise noted.  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  PATIENT SURVEYS:  FOTO    SCREENING FOR RED FLAGS: Bowel or bladder incontinence: No Spinal tumors: No Cauda equina syndrome: No Compression fracture: No Abdominal aneurysm: No  COGNITION: Overall cognitive status: Within functional limits for tasks assessed     SENSATION: WFL  MUSCLE LENGTH:  POSTURE: No Significant postural limitations  PALPATION:   LUMBAR ROM:   AROM eval  Flexion No pain  Extension No pain  Right lateral flexion No pain  Left lateral flexion No pain  Right rotation No pain  Left rotation And on the right   (Blank rows = not tested)  LOWER EXTREMITY ROM:     Active  Right eval Left eval  Hip flexion Full Full  Hip extension    Hip abduction    Hip adduction    Hip internal rotation    Hip external rotation    Knee flexion Full Full  Knee extension Full Full  Ankle dorsiflexion    Ankle plantarflexion    Ankle inversion    Ankle eversion     (Blank rows = not tested)  LOWER EXTREMITY MMT:    MMT Right eval Left eval  Hip flexion 36.3 33.4  Hip extension    Hip abduction 30.8 34.8  Hip  adduction    Hip internal rotation    Hip external rotation    Knee flexion    Knee extension 41.1 42.1  Ankle dorsiflexion    Ankle plantarflexion    Ankle inversion    Ankle eversion     (Blank rows = not tested)   GAIT: Normal gait TODAY'S TREATMENT:                                                                                                                              DATE:    1/3   Exercises - Child's Pose with Thread the Needle   10 reps - 3 hold Piriformis stretch 30seconds x2ea Cross legged fwd flexion stretch 2x30sec Cross legged lateral reach stretch 30seconds x2ea -sidelying hip abduction 2x10ea -Bird dog x5ea (cues required) -prone hip extension 2x10ea -Supine SLR with TrA 2x5ea -Sit to stands plinth 2x10 -HEP updated/review    12/17   Exercises - Child's Pose with Thread the Needle   10 reps - 3 hold -- Supine Bridge with Green Resistance Band  -1 sets - 20 reps - 2 hold - Cable Shoulder extension 1 x daily -- 3 sets - 10 reps 20lbs [employer] -  2 sets - 10 reps (unilateral cable row) 20lbs  Eval:  Program Notes  Lie on Stomach. If it feels better keep doing.   Exercises - Supine Piriformis Stretch with Foot on Ground  - 1 x daily - 7 x weekly - 3 sets - 3 reps - 20 sec hold  hold - Standing Glute Med Mobilization with Small Ball on Wall  - 1 x daily - 7 x weekly - 3 sets - 10 reps - Supine Bridge with Resistance Band  - 1 x daily - 7 x weekly - 3 sets - 10 reps - Hooklying Clamshell with Resistance  - 1 x daily - 7 x weekly - 3 sets - 10 reps - Shoulder extension with resistance - Neutral  - 1 x daily - 7 x weekly - 3 sets - 10 reps - Scapular Retraction with Resistance  - 1 x daily - 7 x weekly - 3 sets - 10 reps   PATIENT EDUCATION:  Education details:HEP, symptom management  Person educated: Patient Education method: Explanation, Demonstration, Tactile cues, Verbal cues, and Handouts Education comprehension: verbalized understanding,  returned demonstration, verbal cues required, tactile cues required, and needs further education  HOME EXERCISE PROGRAM: Access Code: [email or web address] URL: [email or web address] Date: 09/12/2023 Prepared by: Alm Don  Program Notes Lie on Stomach. If it feels better keep doing.   Exercises - Supine Piriformis Stretch with Foot on Ground  - 1 x daily - 7 x weekly - 3 sets - 3 reps - 20 sec hold  hold - Standing Glute Med Mobilization with Small Ball on Wall  - 1 x daily - 7 x weekly - 3 sets - 10 reps - Supine Bridge with Resistance Band  - 1 x daily - 7 x weekly - 3 sets - 10 reps - Hooklying Clamshell with Resistance  - 1 x daily - 7 x weekly - 3 sets - 10 reps - Shoulder extension with resistance - Neutral  - 1 x daily - 7 x weekly - 3 sets - 10 reps - Scapular Retraction with Resistance  - 1 x daily - 7 x weekly - 3 sets - 10 reps  ASSESSMENT:  CLINICAL IMPRESSION: Pt significantly challenged by supine SLR with TrA. Also notable tightness in bilateral hip and low back with cross legged stretching, which she reported benefit from. Fatigues quickly with sidelying hip abduction. Pt will benefit from continued PT to progress core and hip strengthening.   OBJECTIVE IMPAIRMENTS: decreased activity tolerance, increased fascial restrictions, and pain.   ACTIVITY LIMITATIONS: bending, standing, squatting, and locomotion level  PARTICIPATION LIMITATIONS: meal prep, cleaning, laundry, community activity, and occupation  PERSONAL FACTORS: None   REHAB POTENTIAL: Excellent  CLINICAL DECISION MAKING: Stable/uncomplicated  EVALUATION COMPLEXITY: Low   GOALS: Goals reviewed with patient? Yes  SHORT TERM GOALS: Target date: 10/09/2023    Patient will demonstrate equal hip abduction strength  Baseline: Goal status: INITIAL  2.  Patient will report a 75% reduction in tenderness to trigger point  Baseline:  Goal status: INITIAL  3.  Patient will be independent with HEP   Baseline:  Goal status: INITIAL   LONG TERM GOALS: Target date: 11/06/2023     The patient will return to full gym program  Baseline:  Goal status: INITIAL  2.  The patient will perform all house work and activity without pain  Baseline:  Goal status: INITIAL   PLAN:  PT FREQUENCY: 1x/week  PT DURATION: 8 weeks  PLANNED INTERVENTIONS: 97110-Therapeutic exercises, 97530- Therapeutic activity, [id number]- Neuromuscular re-education, 97535- Self Care, 02859- Manual therapy,  [id number]- Aquatic  Therapy, 97014- Electrical stimulation (unattended), [id number]- Ultrasound, Patient/Family education, Taping, Dry Needling, DME instructions, Cryotherapy, and Moist heat .  PLAN FOR NEXT SESSION:   If patient remains asymptomatic review gym exercises. Consider reviewing prayer press and hamstring stress and prayer stretch. If patient is in pain consider manual therapy and needling.    Asberry BRAVO Madicyn Mesina, PTA 10/13/2023, 9:42 AM

## 2023-10-19 ENCOUNTER — Encounter (HOSPITAL_BASED_OUTPATIENT_CLINIC_OR_DEPARTMENT_OTHER): Payer: Self-pay

## 2023-10-19 ENCOUNTER — Ambulatory Visit (HOSPITAL_BASED_OUTPATIENT_CLINIC_OR_DEPARTMENT_OTHER): Payer: Self-pay | Admitting: Physical Therapy

## 2023-11-01 ENCOUNTER — Encounter (HOSPITAL_BASED_OUTPATIENT_CLINIC_OR_DEPARTMENT_OTHER): Payer: Self-pay | Admitting: Physical Therapy

## 2023-12-04 ENCOUNTER — Other Ambulatory Visit (HOSPITAL_BASED_OUTPATIENT_CLINIC_OR_DEPARTMENT_OTHER): Payer: Self-pay

## 2023-12-04 MED ORDER — KETOCONAZOLE 2 % EX SHAM
MEDICATED_SHAMPOO | CUTANEOUS | 11 refills | Status: AC
  Filled 2023-12-04: qty 120, 90d supply, fill #0
  Filled 2024-02-29: qty 120, 90d supply, fill #1

## 2023-12-04 MED ORDER — CLOBETASOL PROPIONATE 0.05 % EX OINT
TOPICAL_OINTMENT | CUTANEOUS | 11 refills | Status: AC
  Filled 2023-12-04: qty 60, 90d supply, fill #0
  Filled 2024-02-29: qty 60, 30d supply, fill #1

## 2023-12-07 ENCOUNTER — Other Ambulatory Visit (HOSPITAL_BASED_OUTPATIENT_CLINIC_OR_DEPARTMENT_OTHER): Payer: Self-pay | Admitting: Family Medicine

## 2023-12-07 DIAGNOSIS — Z1231 Encounter for screening mammogram for malignant neoplasm of breast: Secondary | ICD-10-CM

## 2023-12-08 ENCOUNTER — Encounter: Payer: Self-pay | Admitting: Family Medicine

## 2023-12-08 ENCOUNTER — Ambulatory Visit (INDEPENDENT_AMBULATORY_CARE_PROVIDER_SITE_OTHER): Payer: No Typology Code available for payment source | Admitting: Family Medicine

## 2023-12-08 ENCOUNTER — Other Ambulatory Visit (HOSPITAL_BASED_OUTPATIENT_CLINIC_OR_DEPARTMENT_OTHER): Payer: Self-pay

## 2023-12-08 VITALS — BP 110/74 | HR 87 | Temp 98.3°F | Ht 67.5 in | Wt 187.0 lb

## 2023-12-08 DIAGNOSIS — Z0000 Encounter for general adult medical examination without abnormal findings: Secondary | ICD-10-CM

## 2023-12-08 DIAGNOSIS — E663 Overweight: Secondary | ICD-10-CM | POA: Diagnosis not present

## 2023-12-08 MED ORDER — NORETHINDRONE ACET-ETHINYL EST 1-20 MG-MCG PO TABS
1.0000 | ORAL_TABLET | Freq: Every day | ORAL | 3 refills | Status: DC
  Filled 2023-12-08: qty 21, 21d supply, fill #0
  Filled 2023-12-29: qty 21, 21d supply, fill #1
  Filled 2024-01-23: qty 21, 21d supply, fill #2
  Filled 2024-02-13: qty 21, 21d supply, fill #3

## 2023-12-08 NOTE — Progress Notes (Signed)
   Subjective:    Patient ID: Charlotte Garza, female    DOB: 02/12/1981, 43 y.o.   MRN: [id number]  HPI CPE- UTD on pap, Tdap.  Mammogram scheduled.  Patient Care Team    Relationship Specialty Notifications Start End  Sheliah Hatch, MD PCP - General Family Medicine  06/06/13     Health Maintenance  Topic Date Due   COVID-19 Vaccine (3 - Pfizer risk series) 03/10/2020   INFLUENZA VACCINE  05/11/2023   Cervical Cancer Screening (HPV/Pap Cotest)  11/02/2027   DTaP/Tdap/Td (4 - Td or Tdap) 10/21/2032   Hepatitis C Screening  Completed   HIV Screening  Completed   HPV VACCINES  Aged Out      Review of Systems Patient reports no vision/ hearing changes, adenopathy,fever, weight change,  persistant/recurrent hoarseness , swallowing issues, chest pain, palpitations, edema, persistant/recurrent cough, hemoptysis, dyspnea (rest/exertional/paroxysmal nocturnal), gastrointestinal bleeding (melena, rectal bleeding), abdominal pain, significant heartburn, bowel changes, GU symptoms (dysuria, hematuria, incontinence), Gyn symptoms (abnormal  bleeding, pain),  syncope, focal weakness, memory loss, numbness & tingling, skin/hair/nail changes, abnormal bruising or bleeding, anxiety, or depression.     Objective:   Physical Exam General Appearance:    Alert, cooperative, no distress, appears stated age  Head:    Normocephalic, without obvious abnormality, atraumatic  Eyes:    PERRL, conjunctiva/corneas clear, EOM's intact, fundi    benign, both eyes  Ears:    Normal TM's and external ear canals, both ears  Nose:   Nares normal, septum midline, mucosa normal, no drainage    or sinus tenderness  Throat:   Lips, mucosa, and tongue normal; teeth and gums normal  Neck:   Supple, symmetrical, trachea midline, no adenopathy;    Thyroid: no enlargement/tenderness/nodules  Back:     Symmetric, no curvature, ROM normal, no CVA tenderness  Lungs:     Clear to auscultation bilaterally, respirations  unlabored  Chest Wall:    No tenderness or deformity   Heart:    Regular rate and rhythm, S1 and S2 normal, no murmur, rub   or gallop  Breast Exam:    Deferred to GYN  Abdomen:     Soft, non-tender, bowel sounds active all four quadrants,    no masses, no organomegaly  Genitalia:    Deferred to GYN  Rectal:    Extremities:   Extremities normal, atraumatic, no cyanosis or edema  Pulses:   2+ and symmetric all extremities  Skin:   Skin color, texture, turgor normal, no rashes or lesions  Lymph nodes:   Cervical, supraclavicular, and axillary nodes normal  Neurologic:   CNII-XII intact, normal strength, sensation and reflexes    throughout          Assessment & Plan:

## 2023-12-08 NOTE — Assessment & Plan Note (Signed)
 Pt's PE WNL.  UTD on pap, mammo scheduled.  UTD on Tdap.  Check labs.  Anticipatory guidance provided.

## 2023-12-08 NOTE — Patient Instructions (Signed)
Follow up in 1 year or as needed We'll notify you of your lab results and make any changes if needed Keep up the good work on healthy diet and regular exercise- you're doing great!! Call with any questions or concerns Stay Safe!  Stay Healthy! Happy Spring!!! 

## 2023-12-09 LAB — LIPID PANEL
Cholesterol: 135 mg/dL (ref <200)
HDL: 63 mg/dL (ref >=50)
LDL Cholesterol (Calc): 59 mg/dL
Non-HDL Cholesterol (Calc): 72 mg/dL (ref <130)
Total CHOL/HDL Ratio: 2.1 (calc) (ref <5.0)
Triglycerides: 54 mg/dL (ref <150)

## 2023-12-09 LAB — CBC WITH DIFFERENTIAL/PLATELET
Absolute Lymphocytes: 1632 {cells}/uL (ref 850–3900)
Absolute Monocytes: 822 {cells}/uL (ref 200–950)
Basophils Absolute: 30 {cells}/uL (ref 0–200)
Basophils Relative: 0.5 %
Eosinophils Absolute: 60 {cells}/uL (ref 15–500)
Eosinophils Relative: 1 %
HCT: 40.8 % (ref 35.0–45.0)
Hemoglobin: 13.6 g/dL (ref 11.7–15.5)
MCH: 30.5 pg (ref 27.0–33.0)
MCHC: 33.3 g/dL (ref 32.0–36.0)
MCV: 91.5 fL (ref 80.0–100.0)
MPV: 10.5 fL (ref 7.5–12.5)
Monocytes Relative: 13.7 %
Neutro Abs: 3456 {cells}/uL (ref 1500–7800)
Neutrophils Relative %: 57.6 %
Platelets: 331 10*3/uL (ref 140–400)
RBC: 4.46 10*6/uL (ref 3.80–5.10)
RDW: 11.2 % (ref 11.0–15.0)
Total Lymphocyte: 27.2 %
WBC: 6 10*3/uL (ref 3.8–10.8)

## 2023-12-09 LAB — HEPATIC FUNCTION PANEL
AG Ratio: 1.5 (calc) (ref 1.0–2.5)
ALT: 22 U/L (ref 6–29)
AST: 17 U/L (ref 10–30)
Albumin: 4.3 g/dL (ref 3.6–5.1)
Alkaline phosphatase (APISO): 74 U/L (ref 31–125)
Bilirubin, Direct: 0.2 mg/dL (ref 0.0–0.2)
Globulin: 2.8 g/dL (ref 1.9–3.7)
Indirect Bilirubin: 0.4 mg/dL (ref 0.2–1.2)
Total Bilirubin: 0.6 mg/dL (ref 0.2–1.2)
Total Protein: 7.1 g/dL (ref 6.1–8.1)

## 2023-12-09 LAB — BASIC METABOLIC PANEL
BUN: 12 mg/dL (ref 7–25)
CO2: 26 mmol/L (ref 20–32)
Calcium: 9.4 mg/dL (ref 8.6–10.2)
Chloride: 107 mmol/L (ref 98–110)
Creat: 0.85 mg/dL (ref 0.50–0.99)
Glucose, Bld: 83 mg/dL (ref 65–99)
Potassium: 4.7 mmol/L (ref 3.5–5.3)
Sodium: 141 mmol/L (ref 135–146)

## 2023-12-09 LAB — VITAMIN D 25 HYDROXY (VIT D DEFICIENCY, FRACTURES): Vit D, 25-Hydroxy: 18 ng/mL — ABNORMAL LOW (ref 30–100)

## 2023-12-09 LAB — TSH: TSH: 1.1 m[IU]/L

## 2023-12-11 ENCOUNTER — Ambulatory Visit (HOSPITAL_BASED_OUTPATIENT_CLINIC_OR_DEPARTMENT_OTHER)
Admission: RE | Admit: 2023-12-11 | Discharge: 2023-12-11 | Disposition: A | Discharge status: Home / Self Care | Payer: No Typology Code available for payment source | Source: Ambulatory Visit | Attending: Family Medicine | Admitting: Family Medicine

## 2023-12-11 ENCOUNTER — Other Ambulatory Visit (HOSPITAL_BASED_OUTPATIENT_CLINIC_OR_DEPARTMENT_OTHER): Payer: Self-pay

## 2023-12-11 ENCOUNTER — Encounter (HOSPITAL_BASED_OUTPATIENT_CLINIC_OR_DEPARTMENT_OTHER): Payer: Self-pay

## 2023-12-11 ENCOUNTER — Other Ambulatory Visit: Payer: Self-pay

## 2023-12-11 ENCOUNTER — Encounter: Payer: Self-pay | Admitting: Family Medicine

## 2023-12-11 ENCOUNTER — Telehealth: Payer: Self-pay

## 2023-12-11 DIAGNOSIS — Z1231 Encounter for screening mammogram for malignant neoplasm of breast: Secondary | ICD-10-CM | POA: Diagnosis present

## 2023-12-11 MED ORDER — VITAMIN D (ERGOCALCIFEROL) 1.25 MG (50000 UNIT) PO CAPS
50000.0000 [IU] | ORAL_CAPSULE | ORAL | 0 refills | Status: AC
  Filled 2023-12-11: qty 4, 28d supply, fill #0
  Filled 2024-02-09: qty 4, 28d supply, fill #1
  Filled 2024-05-06: qty 4, 28d supply, fill #2

## 2023-12-11 NOTE — Telephone Encounter (Signed)
 Vitamin d has been sent in Pt has reviewed via MyChart

## 2023-12-11 NOTE — Telephone Encounter (Signed)
-----   Message from Neena Rhymes sent at 12/11/2023  7:47 AM EST ----- Labs look great w/ exception of low Vit D.  Based on this, we need to start 50,000 units weekly x12 weeks in addition to daily OTC supplement of at least 2000 units.

## 2023-12-14 ENCOUNTER — Other Ambulatory Visit: Payer: Self-pay | Admitting: Family Medicine

## 2023-12-14 DIAGNOSIS — R928 Other abnormal and inconclusive findings on diagnostic imaging of breast: Secondary | ICD-10-CM

## 2023-12-16 ENCOUNTER — Other Ambulatory Visit (HOSPITAL_BASED_OUTPATIENT_CLINIC_OR_DEPARTMENT_OTHER): Payer: Self-pay

## 2023-12-16 MED ORDER — ZEPBOUND 2.5 MG/0.5ML ~~LOC~~ SOAJ
2.5000 mg | SUBCUTANEOUS | 0 refills | Status: AC
  Filled 2023-12-16: qty 2, 28d supply, fill #0

## 2023-12-25 ENCOUNTER — Ambulatory Visit
Admission: RE | Admit: 2023-12-25 | Discharge: 2023-12-25 | Disposition: A | Discharge status: Home / Self Care | Source: Ambulatory Visit | Attending: Family Medicine | Admitting: Family Medicine

## 2023-12-25 DIAGNOSIS — R928 Other abnormal and inconclusive findings on diagnostic imaging of breast: Secondary | ICD-10-CM

## 2023-12-29 ENCOUNTER — Other Ambulatory Visit (HOSPITAL_BASED_OUTPATIENT_CLINIC_OR_DEPARTMENT_OTHER): Payer: Self-pay

## 2023-12-29 ENCOUNTER — Other Ambulatory Visit: Payer: Self-pay

## 2023-12-30 ENCOUNTER — Other Ambulatory Visit (HOSPITAL_BASED_OUTPATIENT_CLINIC_OR_DEPARTMENT_OTHER): Payer: Self-pay

## 2024-01-29 ENCOUNTER — Other Ambulatory Visit (HOSPITAL_BASED_OUTPATIENT_CLINIC_OR_DEPARTMENT_OTHER): Payer: Self-pay

## 2024-02-09 ENCOUNTER — Other Ambulatory Visit (HOSPITAL_BASED_OUTPATIENT_CLINIC_OR_DEPARTMENT_OTHER): Payer: Self-pay

## 2024-02-13 ENCOUNTER — Other Ambulatory Visit (HOSPITAL_BASED_OUTPATIENT_CLINIC_OR_DEPARTMENT_OTHER): Payer: Self-pay

## 2024-02-13 ENCOUNTER — Other Ambulatory Visit: Payer: Self-pay

## 2024-02-19 ENCOUNTER — Other Ambulatory Visit (HOSPITAL_BASED_OUTPATIENT_CLINIC_OR_DEPARTMENT_OTHER): Payer: Self-pay

## 2024-02-19 ENCOUNTER — Encounter: Payer: Self-pay | Admitting: Family Medicine

## 2024-02-19 MED ORDER — NORETHINDRONE ACET-ETHINYL EST 1-20 MG-MCG PO TABS
1.0000 | ORAL_TABLET | Freq: Every day | ORAL | 3 refills | Status: DC
  Filled 2024-02-19: qty 90, 90d supply, fill #0

## 2024-02-19 NOTE — Addendum Note (Signed)
 Addended by: Aurelie Dicenzo E on: 02/19/2024 04:10 PM   Modules accepted: Orders

## 2024-02-21 MED ORDER — NORETHINDRONE ACET-ETHINYL EST 1-20 MG-MCG PO TABS: 1.0000 | ORAL_TABLET | Freq: Every day | ORAL | 3 refills | Status: AC

## 2024-02-21 NOTE — Addendum Note (Signed)
 Addended by: Jeanet Lupe K on: 02/21/2024 09:05 AM   Modules accepted: Orders

## 2024-03-01 ENCOUNTER — Other Ambulatory Visit (HOSPITAL_BASED_OUTPATIENT_CLINIC_OR_DEPARTMENT_OTHER): Payer: Self-pay

## 2024-05-06 ENCOUNTER — Other Ambulatory Visit (HOSPITAL_BASED_OUTPATIENT_CLINIC_OR_DEPARTMENT_OTHER): Payer: Self-pay

## 2024-05-16 ENCOUNTER — Other Ambulatory Visit (HOSPITAL_BASED_OUTPATIENT_CLINIC_OR_DEPARTMENT_OTHER): Payer: Self-pay

## 2024-05-23 ENCOUNTER — Other Ambulatory Visit: Payer: Self-pay

## 2024-08-05 ENCOUNTER — Other Ambulatory Visit (HOSPITAL_BASED_OUTPATIENT_CLINIC_OR_DEPARTMENT_OTHER): Payer: Self-pay

## 2024-08-05 DIAGNOSIS — L089 Local infection of the skin and subcutaneous tissue, unspecified: Secondary | ICD-10-CM | POA: Diagnosis not present

## 2024-08-05 DIAGNOSIS — L6681 Central centrifugal cicatricial alopecia: Secondary | ICD-10-CM | POA: Diagnosis not present

## 2024-08-05 MED ORDER — KETOCONAZOLE 2 % EX SHAM
MEDICATED_SHAMPOO | CUTANEOUS | 11 refills | Status: AC
  Filled 2024-08-05: qty 120, 30d supply, fill #0

## 2024-08-05 MED ORDER — CLOBETASOL PROPIONATE 0.05 % EX OINT
TOPICAL_OINTMENT | CUTANEOUS | 11 refills | Status: AC
  Filled 2024-08-05: qty 60, 30d supply, fill #0

## 2024-08-06 ENCOUNTER — Other Ambulatory Visit: Payer: Self-pay

## 2024-08-06 ENCOUNTER — Other Ambulatory Visit (HOSPITAL_BASED_OUTPATIENT_CLINIC_OR_DEPARTMENT_OTHER): Payer: Self-pay

## 2024-08-09 ENCOUNTER — Other Ambulatory Visit (HOSPITAL_BASED_OUTPATIENT_CLINIC_OR_DEPARTMENT_OTHER): Payer: Self-pay

## 2024-08-18 ENCOUNTER — Encounter: Payer: Self-pay | Admitting: Family Medicine

## 2024-08-19 NOTE — Telephone Encounter (Signed)
 Scheduled a video appt (SAME Day Slot) due to schedule was full for 2 weeks.

## 2024-08-21 ENCOUNTER — Telehealth (INDEPENDENT_AMBULATORY_CARE_PROVIDER_SITE_OTHER): Admitting: Family Medicine

## 2024-08-21 ENCOUNTER — Encounter: Payer: Self-pay | Admitting: Family Medicine

## 2024-08-21 DIAGNOSIS — F5105 Insomnia due to other mental disorder: Secondary | ICD-10-CM | POA: Diagnosis not present

## 2024-08-21 DIAGNOSIS — F419 Anxiety disorder, unspecified: Secondary | ICD-10-CM | POA: Diagnosis not present

## 2024-08-21 NOTE — Progress Notes (Signed)
 Virtual Visit via Video   I connected with patient on 08/21/24 at  2:40 PM EST by a video enabled telemedicine application and verified that I am speaking with the correct person using two identifiers.  Location patient: Home Location provider: [facility], Office Persons participating in the virtual visit: Patient, Provider, CMA Maryjane S)  I discussed the limitations of evaluation and management by telemedicine and the availability of in person appointments. The patient expressed understanding and agreed to proceed.  Subjective:   HPI:   Insomnia- pt has been on Trazodone  for ~2 yrs and it was previously working 'great'.  For the past month or so, pt has been waking at 2-3am and is unable to fall back asleep.  Pt reports that she will sleep 'hard' if doing [facility] seltzer.    ROS:   See pertinent positives and negatives per HPI.  Patient Active Problem List   Diagnosis Date Noted   Trapezius strain 06/05/2014   Routine general medical examination at a health care facility 06/06/2013    Social History   Tobacco Use   Smoking status: Never   Smokeless tobacco: Never  Substance Use Topics   Alcohol use: Yes    Current Outpatient Medications:    albuterol  (VENTOLIN  HFA) 108 (90 Base) MCG/ACT inhaler, Inhale 2 puffs into the lungs every 6 (six) hours as needed for wheezing or shortness of breath., Disp: 6.7 g, Rfl: 0   clobetasol  ointment (TEMOVATE ) 0.05 %, Apply to scalp 3-5 times a week., Disp: 60 g, Rfl: 11   clobetasol  ointment (TEMOVATE ) 0.05 %, Apply to affected area of scalp 3 to 5 times a week, Disp: 60 g, Rfl: 11   fexofenadine (ALLEGRA) 60 MG tablet, Take 60 mg by mouth 2 (two) times daily., Disp: , Rfl:    ketoconazole  (NIZORAL ) 2 % shampoo, Apply 1 Application topically once a week., Disp: , Rfl:    ketoconazole  (NIZORAL ) 2 % shampoo, Apply to damp skin, lather, leave on for 5 minutes, and rinse. Use as shampoo once a week., Disp: 120 mL, Rfl: 11    ketoconazole  (NIZORAL ) 2 % shampoo, Apply to damp skin, lather, leave on 5 minutes, and rinse once a week, Disp: 120 mL, Rfl: 11   methocarbamol  (ROBAXIN ) 500 MG tablet, Take 1 tablet (500 mg total) by mouth 2 (two) times daily as needed for muscle spasms., Disp: 20 tablet, Rfl: 0   norethindrone -ethinyl estradiol  (LOESTRIN  1/20, 21,) 1-20 MG-MCG tablet, Take 1 tablet by mouth daily., Disp: 90 tablet, Rfl: 3   tirzepatide  (ZEPBOUND ) 2.5 MG/0.5ML Pen, , Disp: , Rfl:    traZODone  (DESYREL ) 50 MG tablet, Take 1/2-1 tablet (25-50 mg total) by mouth at bedtime as needed for sleep, Disp: 90 tablet, Rfl: 1   Vitamin D , Ergocalciferol , (DRISDOL ) 1.25 MG (50000 UNIT) CAPS capsule, Take 1 capsule (50,000 Units total) by mouth every 7 (seven) days., Disp: 12 capsule, Rfl: 0   tirzepatide  (ZEPBOUND ) 2.5 MG/0.5ML Pen, Inject 2.5 mg into the skin once a week., Disp: 2 mL, Rfl: 0  No Known Allergies  Objective:   LMP 07/22/2024 (Approximate)   AAOx3, NAD NCAT, EOMI No obvious CN deficits Coloring WNL Pt is able to speak clearly, coherently without shortness of breath or increased work of breathing.  Thought process is linear.  Mood is appropriate.   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  Pt reports until recently Trazodone  has been very effective in managing her sleep issues.  Now she is  not able to sleep unless she uses THC.  She does not want to become dependent on this.  Admits to high stress levels, increased anxiety.  Had to quit her job based on the stressful environment.  Having a difficult time w/ the state of affairs [place].  Will start low dose Fluoxetine to treat underlying anxiety and monitor for improvement.  Pt expressed understanding and is in agreement w/ plan.    Comer Greet, MD 08/21/2024

## 2024-08-22 ENCOUNTER — Other Ambulatory Visit (HOSPITAL_BASED_OUTPATIENT_CLINIC_OR_DEPARTMENT_OTHER): Payer: Self-pay

## 2024-08-22 MED ORDER — FLUOXETINE HCL 10 MG PO CAPS
10.0000 mg | ORAL_CAPSULE | Freq: Every day | ORAL | 3 refills | Status: AC
  Filled 2024-08-22: qty 30, 30d supply, fill #0
  Filled 2024-09-25: qty 30, 30d supply, fill #1

## 2024-09-03 ENCOUNTER — Other Ambulatory Visit: Payer: Self-pay | Admitting: Family Medicine

## 2024-09-03 ENCOUNTER — Other Ambulatory Visit (HOSPITAL_BASED_OUTPATIENT_CLINIC_OR_DEPARTMENT_OTHER): Payer: Self-pay

## 2024-09-03 MED ORDER — TRAZODONE HCL 50 MG PO TABS
25.0000 mg | ORAL_TABLET | Freq: Every evening | ORAL | 1 refills | Status: AC | PRN
  Filled 2024-09-03 – 2024-09-18 (×2): qty 30, 30d supply, fill #0

## 2024-09-13 ENCOUNTER — Other Ambulatory Visit (HOSPITAL_BASED_OUTPATIENT_CLINIC_OR_DEPARTMENT_OTHER): Payer: Self-pay

## 2024-09-18 ENCOUNTER — Other Ambulatory Visit (HOSPITAL_BASED_OUTPATIENT_CLINIC_OR_DEPARTMENT_OTHER): Payer: Self-pay

## 2024-11-01 ENCOUNTER — Telehealth (HOSPITAL_COMMUNITY): Payer: Self-pay | Admitting: Licensed Clinical Social Worker

## 2024-11-01 NOTE — Telephone Encounter (Signed)
 The therapist returns Charlotte Garza's call confirming her identity via 2 identifiers and answers questions regarding how she can possibly get services for her adolescent son.  Zell Maier, MA, LCSW, [facility], LCAS 11/01/2024

## 2024-12-13 ENCOUNTER — Encounter: Payer: No Typology Code available for payment source | Admitting: Family Medicine
